# Patient Record
Sex: Female | Born: 1975 | Race: White | Hispanic: No | Marital: Married | Smoking: Never smoker
Health system: Southern US, Community
[De-identification: ages and names within clinical notes are randomized; demographics above are authoritative.]

---

## 1996-11-26 HISTORY — PX: LAPAROSCOPY: SHX197

## 1999-10-24 ENCOUNTER — Other Ambulatory Visit: Admission: RE | Admit: 1999-10-24 | Discharge: 1999-10-24 | Payer: Self-pay | Admitting: Gynecology

## 2000-11-11 ENCOUNTER — Other Ambulatory Visit: Admission: RE | Admit: 2000-11-11 | Discharge: 2000-11-11 | Payer: Self-pay | Admitting: Gynecology

## 2001-05-19 ENCOUNTER — Other Ambulatory Visit: Admission: RE | Admit: 2001-05-19 | Discharge: 2001-05-19 | Payer: Self-pay | Admitting: Gynecology

## 2001-11-04 ENCOUNTER — Other Ambulatory Visit: Admission: RE | Admit: 2001-11-04 | Discharge: 2001-11-04 | Payer: Self-pay | Admitting: Gynecology

## 2002-11-16 ENCOUNTER — Other Ambulatory Visit: Admission: RE | Admit: 2002-11-16 | Discharge: 2002-11-16 | Payer: Self-pay | Admitting: Gynecology

## 2003-11-23 ENCOUNTER — Other Ambulatory Visit: Admission: RE | Admit: 2003-11-23 | Discharge: 2003-11-23 | Payer: Self-pay | Admitting: Gynecology

## 2004-11-30 ENCOUNTER — Other Ambulatory Visit: Admission: RE | Admit: 2004-11-30 | Discharge: 2004-11-30 | Payer: Self-pay | Admitting: Gynecology

## 2005-08-21 ENCOUNTER — Encounter: Admission: RE | Admit: 2005-08-21 | Discharge: 2005-08-21 | Payer: Self-pay | Admitting: Family Medicine

## 2005-11-09 ENCOUNTER — Encounter: Admission: RE | Admit: 2005-11-09 | Discharge: 2005-11-09 | Payer: Self-pay | Admitting: Family Medicine

## 2005-12-03 ENCOUNTER — Encounter: Admission: RE | Admit: 2005-12-03 | Discharge: 2005-12-03 | Payer: Self-pay | Admitting: Family Medicine

## 2005-12-05 ENCOUNTER — Other Ambulatory Visit: Admission: RE | Admit: 2005-12-05 | Discharge: 2005-12-05 | Payer: Self-pay | Admitting: Gynecology

## 2006-07-31 ENCOUNTER — Encounter: Admission: RE | Admit: 2006-07-31 | Discharge: 2006-07-31 | Payer: Self-pay | Admitting: Family Medicine

## 2006-11-12 ENCOUNTER — Other Ambulatory Visit: Admission: RE | Admit: 2006-11-12 | Discharge: 2006-11-12 | Payer: Self-pay | Admitting: Gynecology

## 2007-01-27 ENCOUNTER — Encounter: Admission: RE | Admit: 2007-01-27 | Discharge: 2007-01-27 | Payer: Self-pay | Admitting: Family Medicine

## 2007-10-21 ENCOUNTER — Ambulatory Visit (HOSPITAL_COMMUNITY): Admission: RE | Admit: 2007-10-21 | Discharge: 2007-10-21 | Payer: Self-pay | Admitting: Gynecology

## 2007-12-09 ENCOUNTER — Encounter (INDEPENDENT_AMBULATORY_CARE_PROVIDER_SITE_OTHER): Payer: Self-pay | Admitting: Plastic Surgery

## 2007-12-09 ENCOUNTER — Ambulatory Visit (HOSPITAL_BASED_OUTPATIENT_CLINIC_OR_DEPARTMENT_OTHER): Admission: RE | Admit: 2007-12-09 | Discharge: 2007-12-09 | Payer: Self-pay | Admitting: Plastic Surgery

## 2008-05-12 ENCOUNTER — Encounter: Admission: RE | Admit: 2008-05-12 | Discharge: 2008-05-12 | Payer: Self-pay | Admitting: Family Medicine

## 2008-05-18 ENCOUNTER — Ambulatory Visit (HOSPITAL_COMMUNITY): Admission: RE | Admit: 2008-05-18 | Discharge: 2008-05-18 | Payer: Self-pay | Admitting: Gynecology

## 2008-11-11 ENCOUNTER — Other Ambulatory Visit: Admission: RE | Admit: 2008-11-11 | Discharge: 2008-11-11 | Payer: Self-pay | Admitting: Gynecology

## 2009-06-21 ENCOUNTER — Encounter: Admission: RE | Admit: 2009-06-21 | Discharge: 2009-06-21 | Payer: Self-pay | Admitting: Gynecology

## 2009-07-31 IMAGING — MG MM SCREEN MAMMOGRAM BILATERAL
4 series · 4 of 4 positions shown · non-contrast
Comparison: none

DG SCREEN MAMMOGRAM BILATERAL
Bilateral CC and MLO view(s) were taken.

DIGITAL SCREENING MAMMOGRAM WITH CAD:
The breast tissue is heterogeneously dense.  No masses or malignant type calcifications are 
identified.  Compared with prior studies.

[R CC]
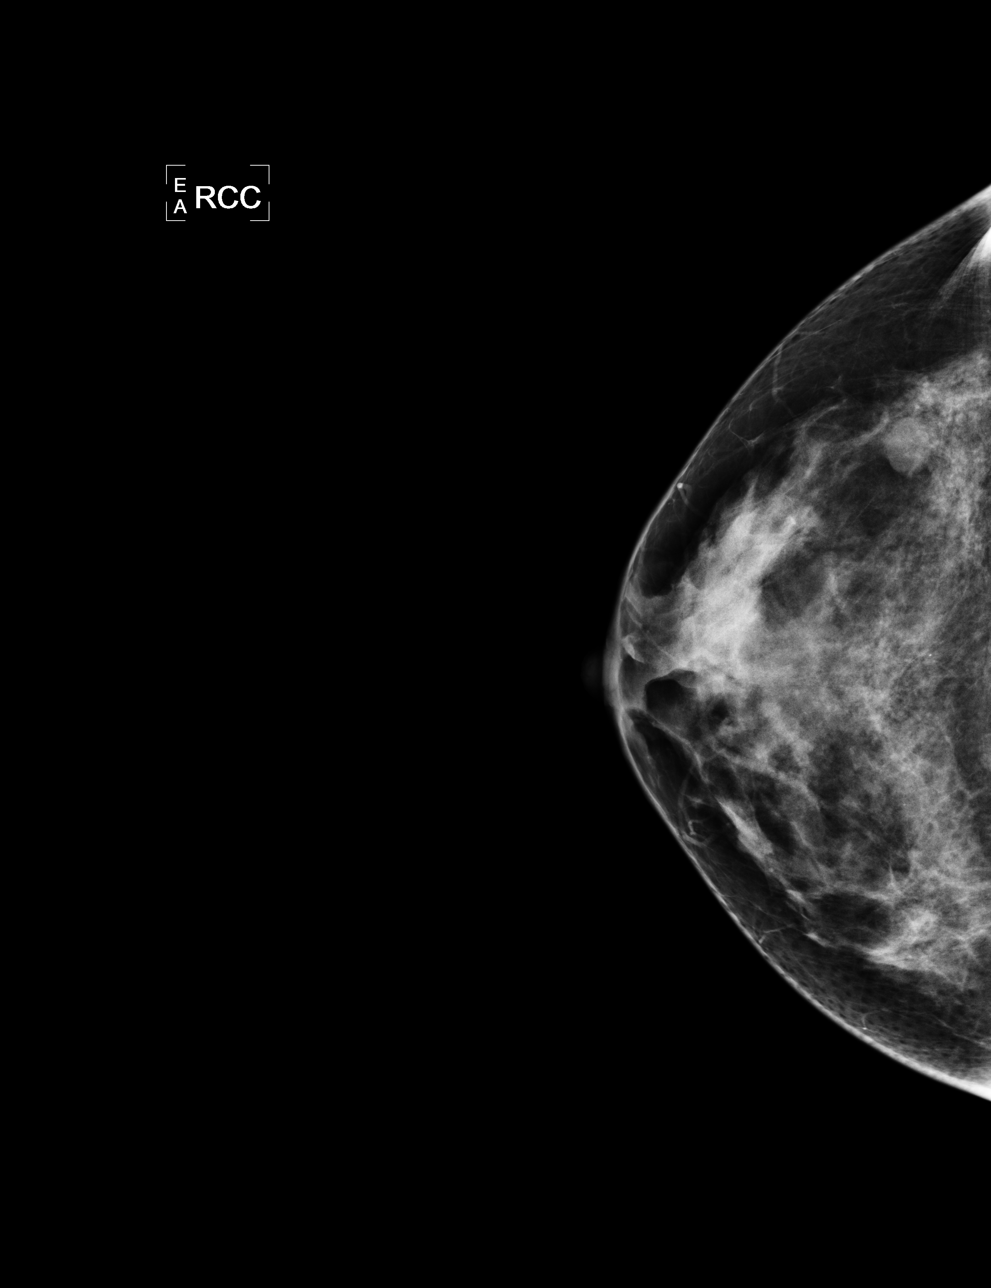

[L CC]
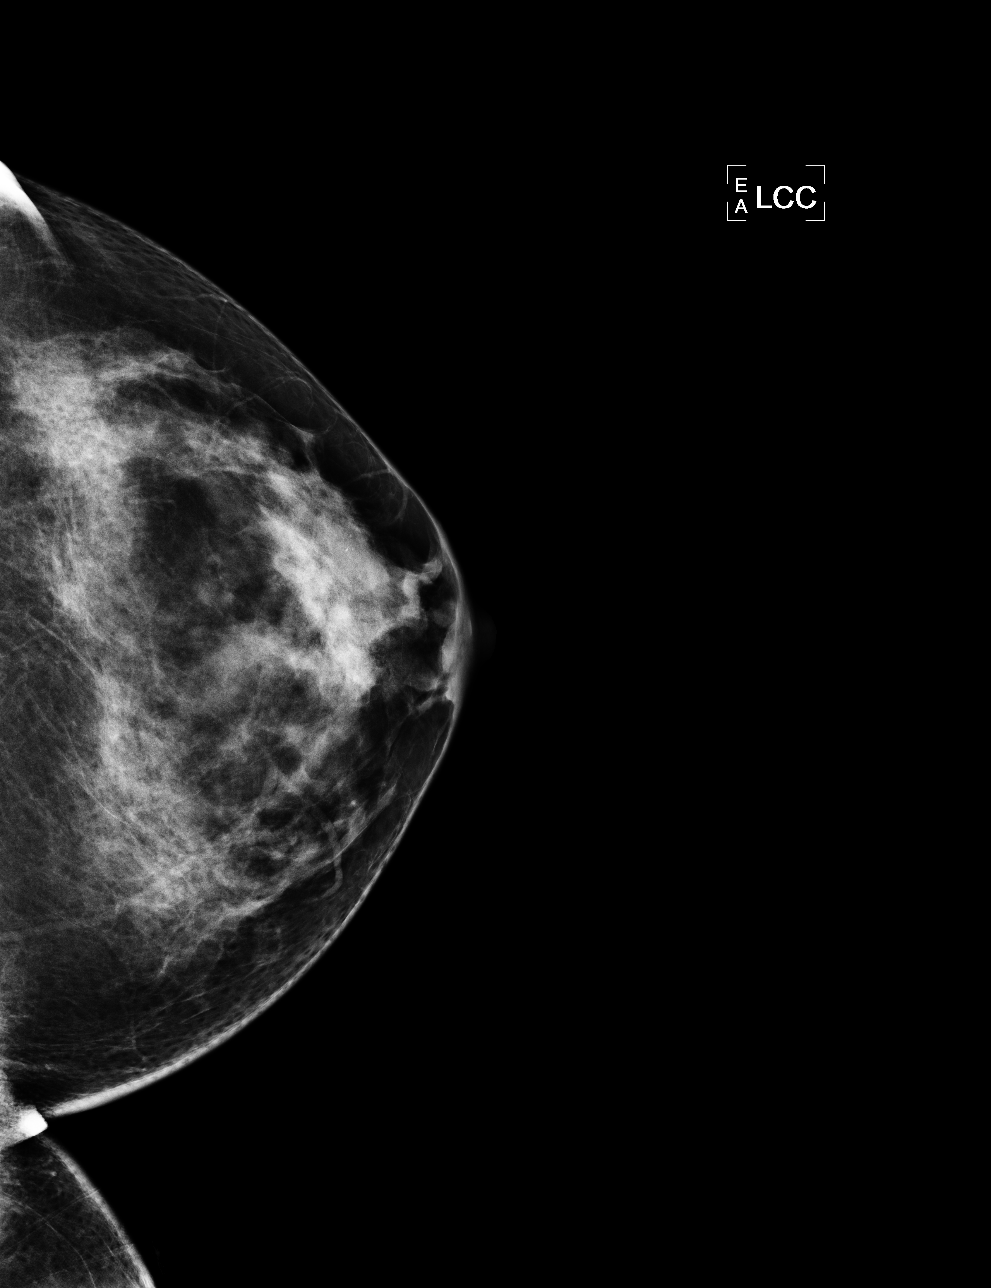

[L MLO]
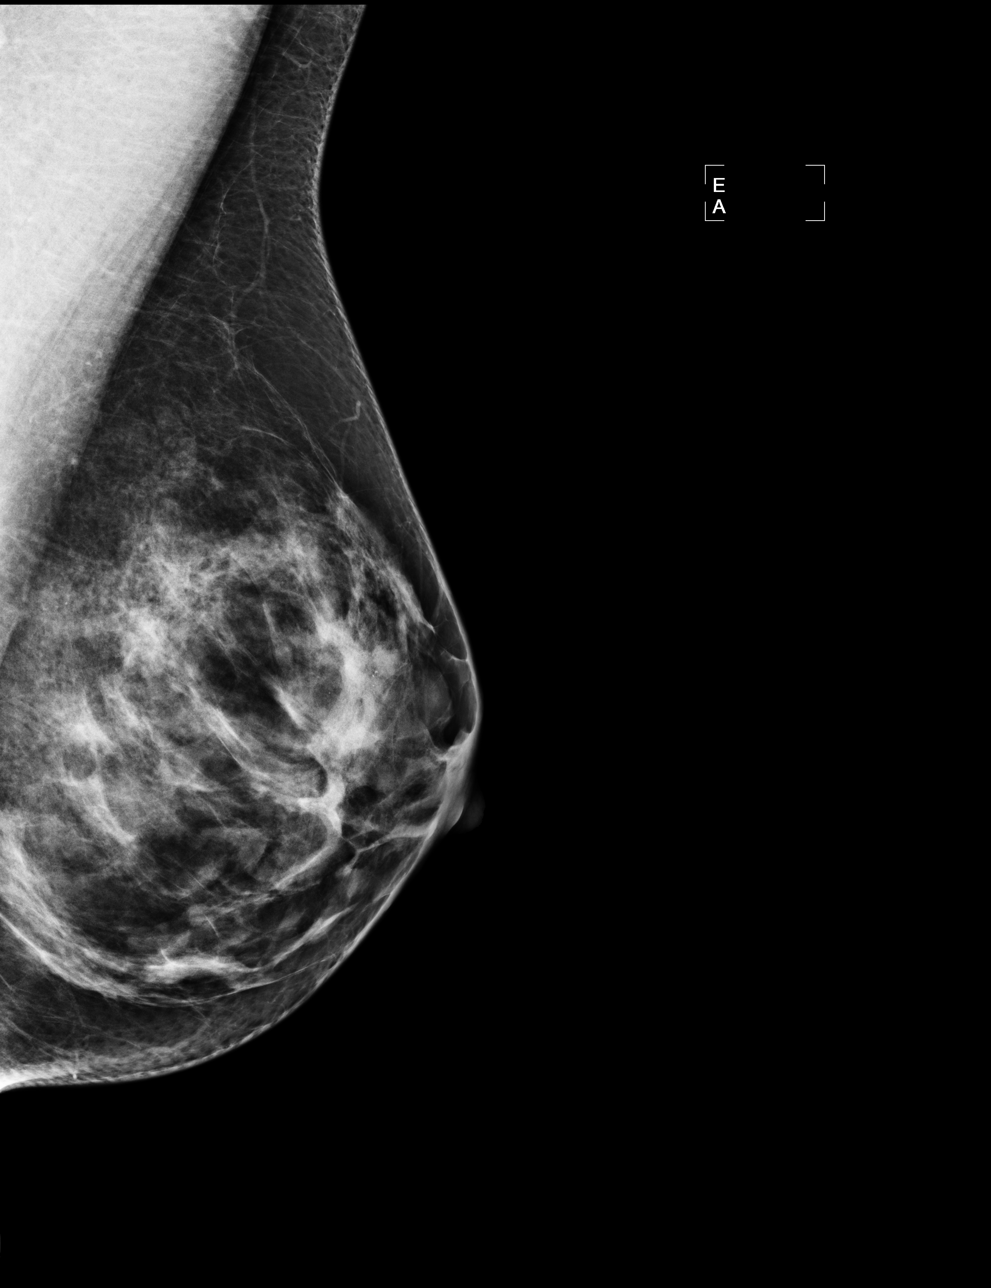

[R MLO]
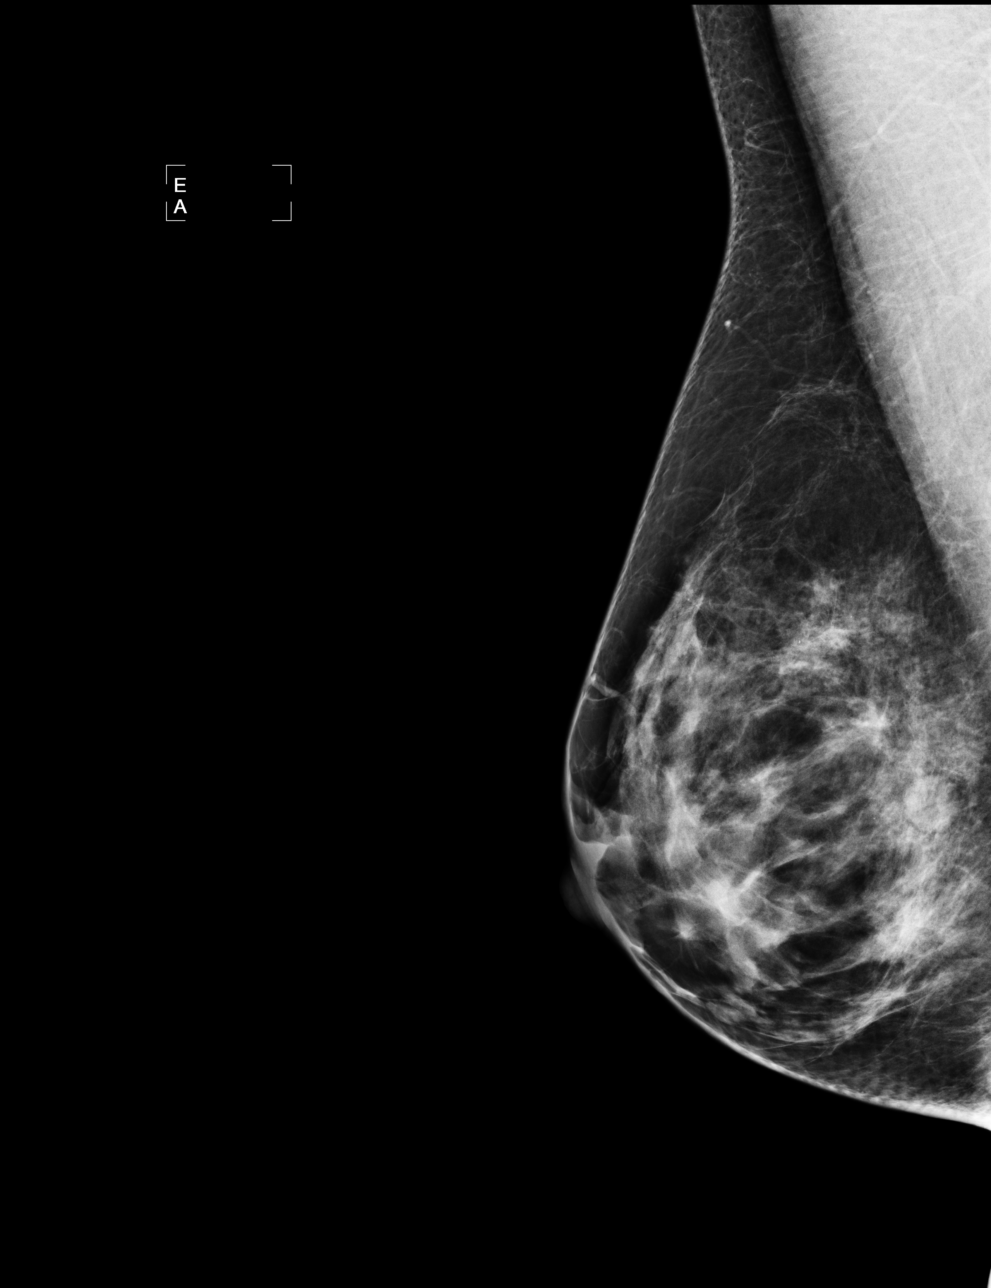

[4 of 4 positions shown; findings below may reference images not displayed]

IMPRESSION: No specific mammographic evidence of malignancy.  Next screening mammogram is recommended in one 
year.

ASSESSMENT: Negative - BI-RADS 1

Screening mammogram in 1 year.
ANALYZED BY COMPUTER AIDED DETECTION. , THIS PROCEDURE WAS A DIGITAL MAMMOGRAM.

## 2009-12-14 ENCOUNTER — Inpatient Hospital Stay (HOSPITAL_COMMUNITY): Admission: AD | Admit: 2009-12-14 | Discharge: 2009-12-17 | Payer: Self-pay | Admitting: Obstetrics & Gynecology

## 2009-12-16 ENCOUNTER — Encounter: Payer: Self-pay | Admitting: Obstetrics and Gynecology

## 2010-01-04 ENCOUNTER — Inpatient Hospital Stay (HOSPITAL_COMMUNITY): Admission: AD | Admit: 2010-01-04 | Discharge: 2010-01-22 | Payer: Self-pay | Admitting: Obstetrics and Gynecology

## 2010-01-06 ENCOUNTER — Encounter: Payer: Self-pay | Admitting: Obstetrics and Gynecology

## 2010-01-12 ENCOUNTER — Encounter: Payer: Self-pay | Admitting: Obstetrics and Gynecology

## 2010-01-12 IMAGING — US US OB COMP EACH ADDL GEST+14 WKS
1 series · 14 of 28 positions shown · non-contrast
Comparison: none

OBSTETRICAL ULTRASOUND:
 This ultrasound was performed in The [HOSPITAL], and the [facility] report will be stored to [REDACTED] PACS.  This report is also available in [HOSPITAL]?s accessANYware.

[Series 1: us ob comp each addl gest+14 wks · 14 of 107 slices shown]
[im 4/107]
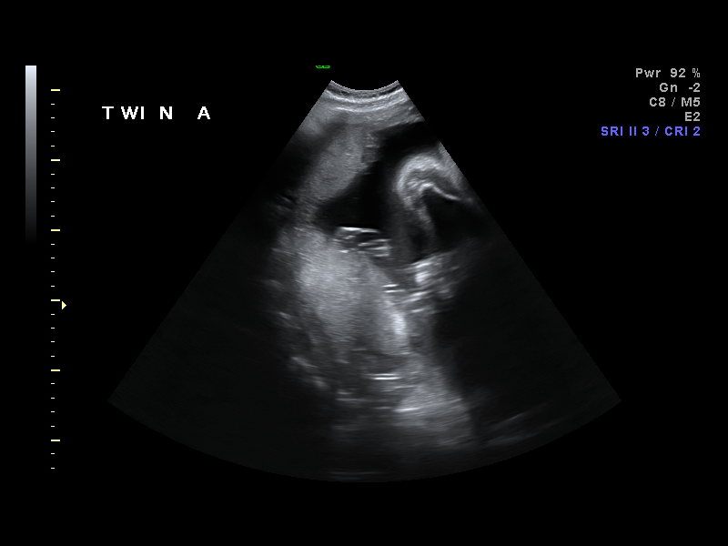
[im 12/107]
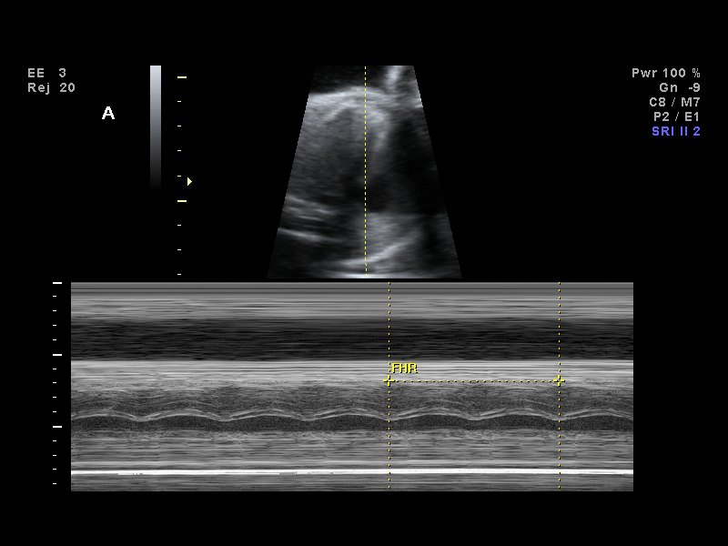
[im 20/107]
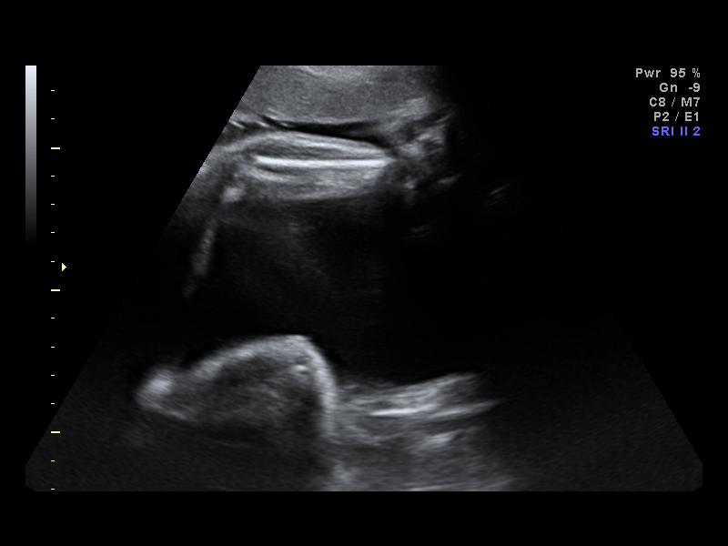
[im 28/107]
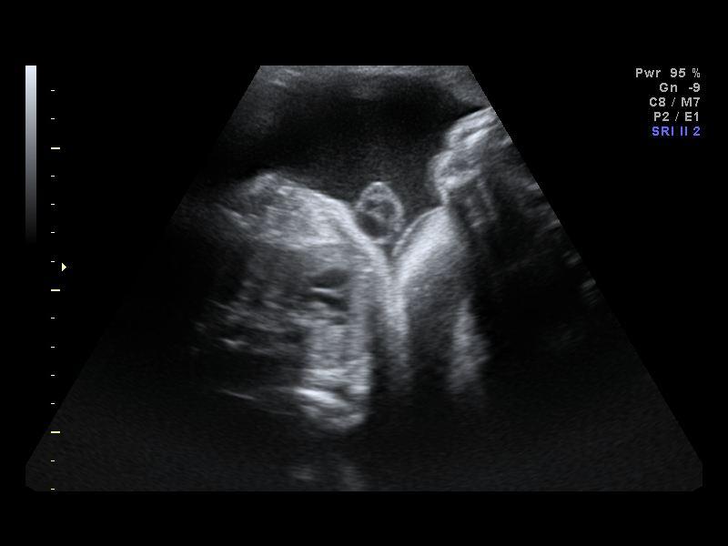
[im 36/107]
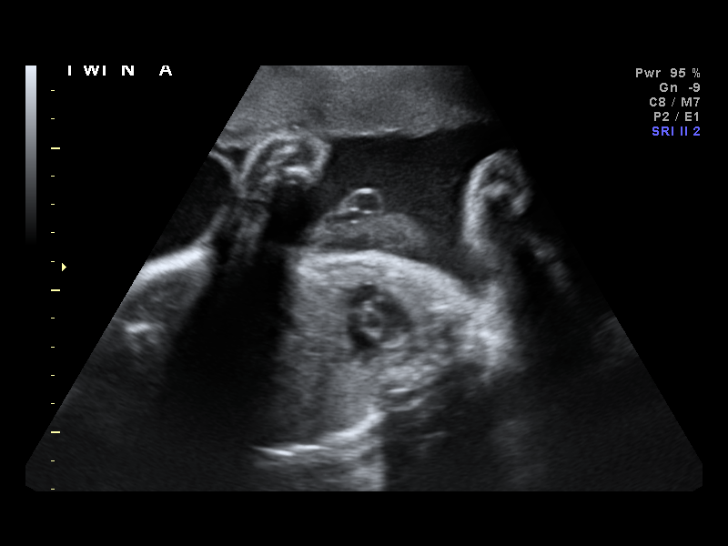
[im 44/107]
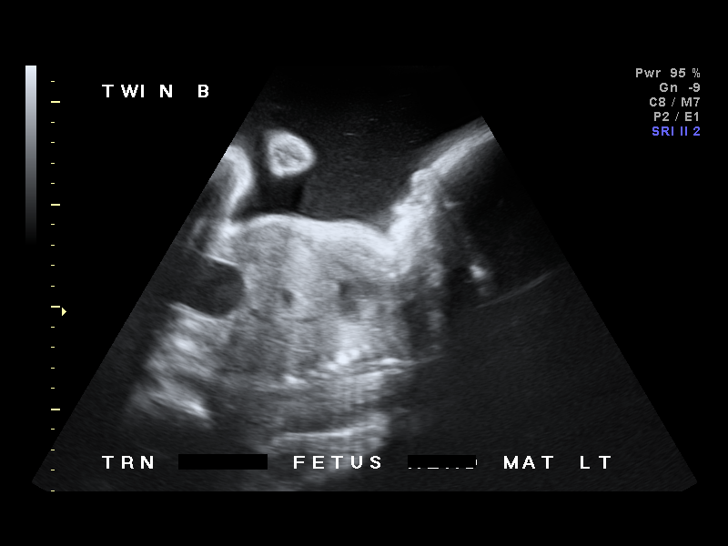
[im 52/107]
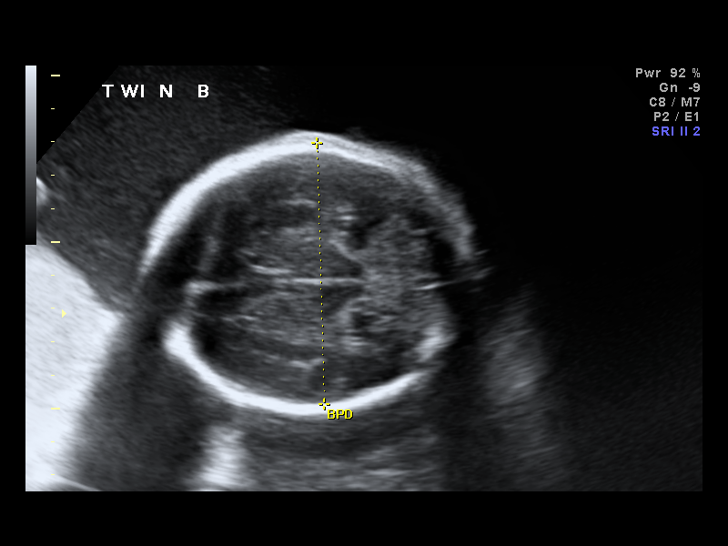
[im 59/107]
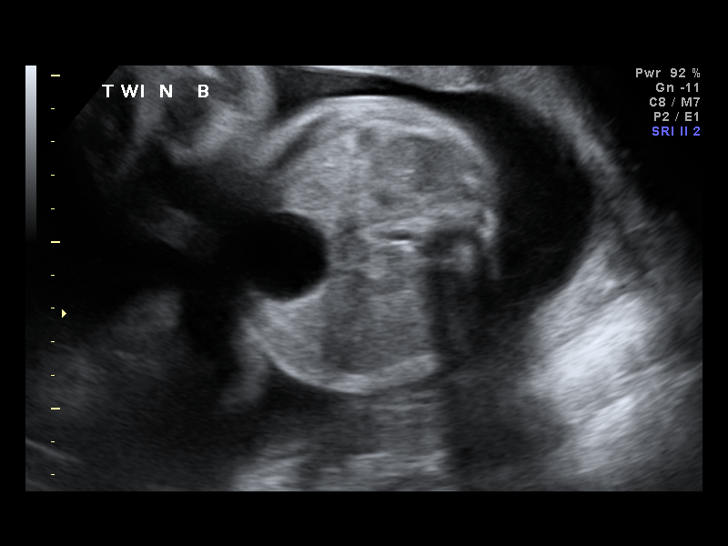
[im 67/107]
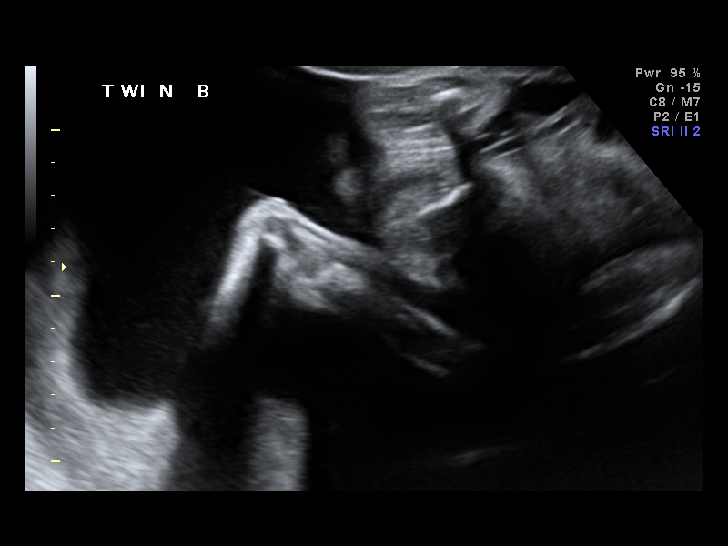
[im 75/107]
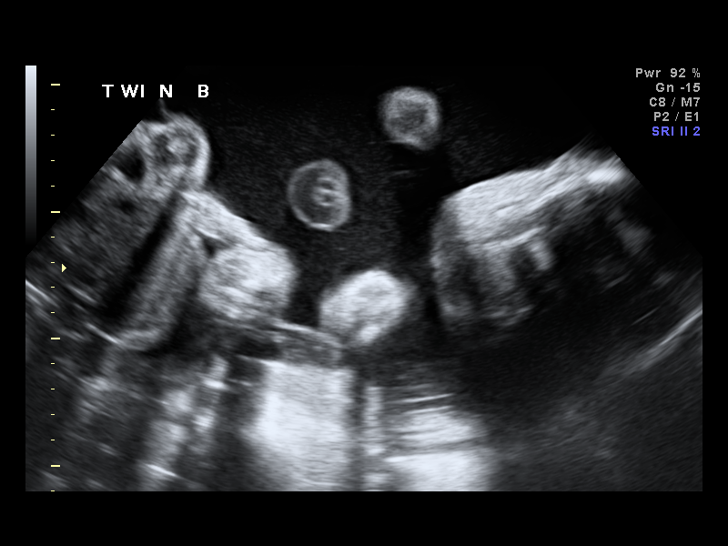
[im 83/107]
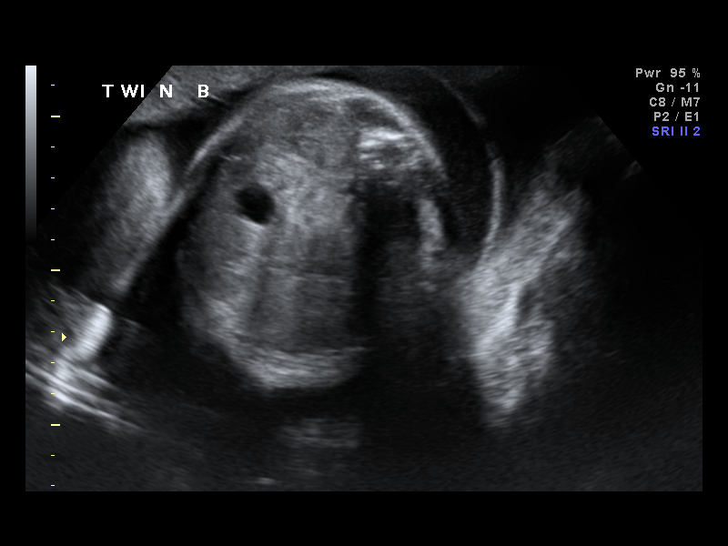
[im 91/107]
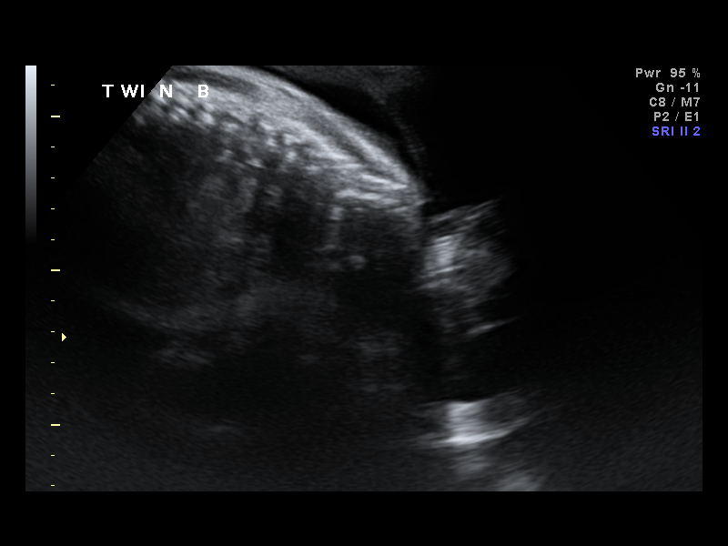
[im 99/107]
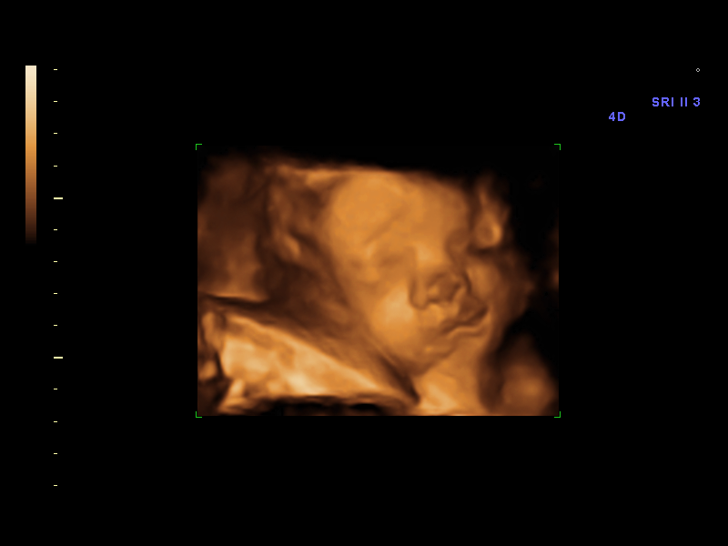
[im 107/107]
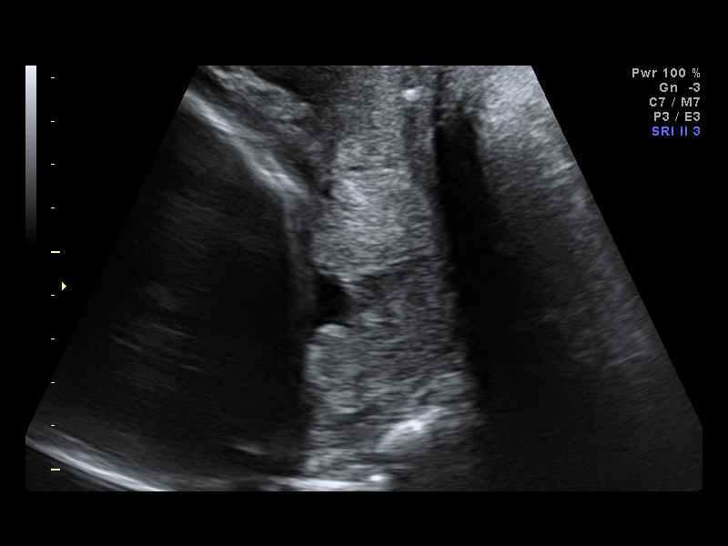

[14 of 28 positions shown; findings below may reference images not displayed]

IMPRESSION: [facility] has also been faxed to the ordering physician.

## 2010-01-18 ENCOUNTER — Encounter (INDEPENDENT_AMBULATORY_CARE_PROVIDER_SITE_OTHER): Payer: Self-pay | Admitting: Obstetrics and Gynecology

## 2010-01-23 ENCOUNTER — Encounter: Admission: RE | Admit: 2010-01-23 | Discharge: 2010-02-22 | Payer: Self-pay | Admitting: Obstetrics and Gynecology

## 2010-03-24 ENCOUNTER — Encounter: Admission: RE | Admit: 2010-03-24 | Discharge: 2010-04-23 | Payer: Self-pay | Admitting: Obstetrics and Gynecology

## 2010-04-24 ENCOUNTER — Encounter: Admission: RE | Admit: 2010-04-24 | Discharge: 2010-05-09 | Payer: Self-pay | Admitting: Obstetrics and Gynecology

## 2010-12-14 ENCOUNTER — Other Ambulatory Visit: Payer: Self-pay | Admitting: Dermatology

## 2011-01-04 ENCOUNTER — Other Ambulatory Visit: Payer: Self-pay | Admitting: Dermatology

## 2011-02-12 LAB — STREP B DNA PROBE: Strep Group B Ag: NEGATIVE

## 2011-02-12 LAB — COMPREHENSIVE METABOLIC PANEL
AST: 24 U/L (ref 0–37)
CO2: 23 mEq/L (ref 19–32)
Calcium: 9.5 mg/dL (ref 8.4–10.5)
Creatinine, Ser: 0.66 mg/dL (ref 0.4–1.2)
GFR calc Af Amer: >60 mL/min (ref >60)
GFR calc non Af Amer: >60 mL/min (ref >60)
Total Protein: 5.8 g/dL — ABNORMAL LOW (ref 6.0–8.3)

## 2011-02-12 LAB — CBC
MCHC: 34.3 g/dL (ref 30.0–36.0)
MCV: 98.8 fL (ref 78.0–100.0)
RBC: 3.26 MIL/uL — ABNORMAL LOW (ref 3.87–5.11)
RDW: 13.2 % (ref 11.5–15.5)

## 2011-02-12 LAB — LACTATE DEHYDROGENASE: LDH: 114 U/L (ref 94–250)

## 2011-02-12 LAB — MAGNESIUM: Magnesium: 3.3 mg/dL — ABNORMAL HIGH (ref 1.5–2.5)

## 2011-02-15 LAB — COMPREHENSIVE METABOLIC PANEL
ALT: 17 U/L (ref 0–35)
ALT: 26 U/L (ref 0–35)
AST: 24 U/L (ref 0–37)
Albumin: 1.6 g/dL — ABNORMAL LOW (ref 3.5–5.2)
Albumin: 1.6 g/dL — ABNORMAL LOW (ref 3.5–5.2)
Albumin: 1.7 g/dL — ABNORMAL LOW (ref 3.5–5.2)
Albumin: 1.8 g/dL — ABNORMAL LOW (ref 3.5–5.2)
Alkaline Phosphatase: 103 U/L (ref 39–117)
Alkaline Phosphatase: 106 U/L (ref 39–117)
Alkaline Phosphatase: 123 U/L — ABNORMAL HIGH (ref 39–117)
Alkaline Phosphatase: 85 U/L (ref 39–117)
BUN: 10 mg/dL (ref 6–23)
BUN: 10 mg/dL (ref 6–23)
BUN: 6 mg/dL (ref 6–23)
BUN: 6 mg/dL (ref 6–23)
BUN: 6 mg/dL (ref 6–23)
CO2: 20 mEq/L (ref 19–32)
Calcium: 8.7 mg/dL (ref 8.4–10.5)
Calcium: 8.8 mg/dL (ref 8.4–10.5)
Chloride: 101 mEq/L (ref 96–112)
Chloride: 102 mEq/L (ref 96–112)
Chloride: 102 mEq/L (ref 96–112)
Creatinine, Ser: 1.29 mg/dL — ABNORMAL HIGH (ref 0.4–1.2)
Creatinine, Ser: 1.43 mg/dL — ABNORMAL HIGH (ref 0.4–1.2)
Creatinine, Ser: 1.7 mg/dL — ABNORMAL HIGH (ref 0.4–1.2)
GFR calc Af Amer: >60 mL/min (ref >60)
GFR calc non Af Amer: 42 mL/min — ABNORMAL LOW (ref >60)
Glucose, Bld: 76 mg/dL (ref 70–99)
Glucose, Bld: 78 mg/dL (ref 70–99)
Glucose, Bld: 89 mg/dL (ref 70–99)
Potassium: 3.3 mEq/L — ABNORMAL LOW (ref 3.5–5.1)
Potassium: 3.3 mEq/L — ABNORMAL LOW (ref 3.5–5.1)
Potassium: 3.4 mEq/L — ABNORMAL LOW (ref 3.5–5.1)
Sodium: 131 mEq/L — ABNORMAL LOW (ref 135–145)
Sodium: 133 mEq/L — ABNORMAL LOW (ref 135–145)
Sodium: 138 mEq/L (ref 135–145)
Total Bilirubin: 0.4 mg/dL (ref 0.3–1.2)
Total Bilirubin: 0.8 mg/dL (ref 0.3–1.2)
Total Protein: 4.5 g/dL — ABNORMAL LOW (ref 6.0–8.3)
Total Protein: 4.5 g/dL — ABNORMAL LOW (ref 6.0–8.3)
Total Protein: 4.6 g/dL — ABNORMAL LOW (ref 6.0–8.3)
Total Protein: 4.9 g/dL — ABNORMAL LOW (ref 6.0–8.3)
Total Protein: 5 g/dL — ABNORMAL LOW (ref 6.0–8.3)

## 2011-02-15 LAB — CBC
HCT: 30.5 % — ABNORMAL LOW (ref 36.0–46.0)
HCT: 30.9 % — ABNORMAL LOW (ref 36.0–46.0)
HCT: 31.3 % — ABNORMAL LOW (ref 36.0–46.0)
HCT: 31.8 % — ABNORMAL LOW (ref 36.0–46.0)
HCT: 32.3 % — ABNORMAL LOW (ref 36.0–46.0)
Hemoglobin: 10.4 g/dL — ABNORMAL LOW (ref 12.0–15.0)
Hemoglobin: 10.5 g/dL — ABNORMAL LOW (ref 12.0–15.0)
Hemoglobin: 10.7 g/dL — ABNORMAL LOW (ref 12.0–15.0)
Hemoglobin: 10.9 g/dL — ABNORMAL LOW (ref 12.0–15.0)
Hemoglobin: 11 g/dL — ABNORMAL LOW (ref 12.0–15.0)
Hemoglobin: 11 g/dL — ABNORMAL LOW (ref 12.0–15.0)
Hemoglobin: 9.9 g/dL — ABNORMAL LOW (ref 12.0–15.0)
MCHC: 33.8 g/dL (ref 30.0–36.0)
MCHC: 34 g/dL (ref 30.0–36.0)
MCHC: 34 g/dL (ref 30.0–36.0)
MCHC: 34.1 g/dL (ref 30.0–36.0)
MCHC: 34.8 g/dL (ref 30.0–36.0)
MCV: 98.5 fL (ref 78.0–100.0)
MCV: 98.5 fL (ref 78.0–100.0)
MCV: 98.6 fL (ref 78.0–100.0)
MCV: 99.1 fL (ref 78.0–100.0)
MCV: 99.2 fL (ref 78.0–100.0)
MCV: 99.3 fL (ref 78.0–100.0)
MCV: 99.5 fL (ref 78.0–100.0)
MCV: 99.5 fL (ref 78.0–100.0)
Platelets: 155 10*3/uL (ref 150–400)
Platelets: 160 10*3/uL (ref 150–400)
Platelets: 175 10*3/uL (ref 150–400)
Platelets: 183 10*3/uL (ref 150–400)
Platelets: 198 10*3/uL (ref 150–400)
Platelets: 208 10*3/uL (ref 150–400)
RBC: 2.84 MIL/uL — ABNORMAL LOW (ref 3.87–5.11)
RBC: 3.23 MIL/uL — ABNORMAL LOW (ref 3.87–5.11)
RDW: 12.9 % (ref 11.5–15.5)
RDW: 13 % (ref 11.5–15.5)
RDW: 13 % (ref 11.5–15.5)
RDW: 13.1 % (ref 11.5–15.5)
RDW: 13.2 % (ref 11.5–15.5)
RDW: 13.6 % (ref 11.5–15.5)
WBC: 10.1 10*3/uL (ref 4.0–10.5)
WBC: 7.1 10*3/uL (ref 4.0–10.5)
WBC: 8.3 10*3/uL (ref 4.0–10.5)

## 2011-02-15 LAB — DIFFERENTIAL
Basophils Absolute: 0 10*3/uL (ref 0.0–0.1)
Basophils Relative: 0 % (ref 0–1)
Lymphocytes Relative: 7 % — ABNORMAL LOW (ref 12–46)
Lymphocytes Relative: 9 % — ABNORMAL LOW (ref 12–46)
Lymphs Abs: 0.7 10*3/uL (ref 0.7–4.0)
Monocytes Relative: 8 % (ref 3–12)
Neutro Abs: 7 10*3/uL (ref 1.7–7.7)
Neutro Abs: 8.2 10*3/uL — ABNORMAL HIGH (ref 1.7–7.7)
Neutrophils Relative %: 83 % — ABNORMAL HIGH (ref 43–77)
Neutrophils Relative %: 85 % — ABNORMAL HIGH (ref 43–77)

## 2011-02-15 LAB — URIC ACID
Uric Acid, Serum: 7.7 mg/dL — ABNORMAL HIGH (ref 2.4–7.0)
Uric Acid, Serum: 8.4 mg/dL — ABNORMAL HIGH (ref 2.4–7.0)
Uric Acid, Serum: 9.1 mg/dL — ABNORMAL HIGH (ref 2.4–7.0)

## 2011-02-15 LAB — URINALYSIS, ROUTINE W REFLEX MICROSCOPIC
Bilirubin Urine: NEGATIVE
Glucose, UA: NEGATIVE mg/dL
Protein, ur: NEGATIVE mg/dL
Specific Gravity, Urine: 1.02 (ref 1.005–1.030)
Urobilinogen, UA: 0.2 mg/dL (ref 0.0–1.0)

## 2011-02-15 LAB — URINE MICROSCOPIC-ADD ON

## 2011-02-15 LAB — CREATININE CLEARANCE, URINE, 24 HOUR
Collection Interval-CRCL: 24 hours
Creatinine Clearance: 49 mL/min — ABNORMAL LOW (ref 75–115)
Creatinine, Urine: 70 mg/dL
Creatinine: 1.7 mg/dL — ABNORMAL HIGH (ref 0.4–1.2)

## 2011-02-15 LAB — URINE CULTURE

## 2011-02-15 LAB — PROTEIN, URINE, 24 HOUR: Urine Total Volume-UPROT: 1700 mL

## 2011-02-15 LAB — MAGNESIUM
Magnesium: 3.8 mg/dL — ABNORMAL HIGH (ref 1.5–2.5)
Magnesium: 8.1 mg/dL (ref 1.5–2.5)
Magnesium: 8.8 mg/dL (ref 1.5–2.5)

## 2011-02-15 LAB — LACTATE DEHYDROGENASE: LDH: 191 U/L (ref 94–250)

## 2011-04-10 NOTE — Op Note (Signed)
NAME:  Jessica Graves, Jessica Graves              ACCOUNT NO.:  [id number]   MEDICAL RECORD NO.:  [id number]          PATIENT TYPE:  AMB   LOCATION:  DSC                          FACILITY:  [facility]   PHYSICIAN:  Brantley Persons, M.D.DATE OF BIRTH:  [birth year]   DATE OF PROCEDURE:  12/09/2007  DATE OF DISCHARGE:                               OPERATIVE REPORT   PREOPERATIVE DIAGNOSES:  1. 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  2. Left lower abdomen dysplastic nevus with moderate to marked atypia.   POSTOPERATIVE DIAGNOSES:  1. 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  2. Left lower abdomen dysplastic nevus with moderate to marked atypia.   PROCEDURES:  1. Wide local excision 2.3 cm left paraspinal mid back dysplastic      nevus bordering on early evolving melanoma.  2. Complex closure of 5.0 cm left paraspinal mid back incision.  3. Wide local excision 1.7 cm left lower abdomen dysplastic nevus with      moderate to marked atypia.  4. Complex closure 4.0 cm left lower abdomen incision.   ATTENDING SURGEON:  Brantley Persons, M.D.   ANESTHESIA:  IV sedation along with 1% lidocaine with epinephrine.   ANESTHESIOLOGIST:  Guadalupe Maple, M.D.   COMPLICATIONS:  None.   INDICATIONS FOR PROCEDURE:  The patient is a 35 year old Caucasian  female who has a biopsy-proven left paraspinal mid back dysplastic nevus  bordering on early evolving melanoma along with the left lower abdomen  dysplastic nevus with moderate to marked atypia both of which need wide  local excision.  We will proceed with 5 mm margins on the left  paraspinal mid back dysplastic nevus and 3 mm margins with the left  lower abdomen dysplastic nevus.  She asks that we proceed with surgery  at this time.   PROCEDURE:  The lesions that were to be excised were both marked in the  preop holding area.  The patient was then taken back to the OR and IV  sedation was obtained.  The patient was then placed in the lateral  decubitus position with the right side down.  The back was then prepped  with Betadine and draped in sterile fashion.  The skin and subcutaneous  tissues were injected in the area of the healing biopsy site and  residual dysplastic nevus of the left paraspinal mid back area with the  1% lidocaine with epinephrine.  After adequate hemostasis anesthesia  taken effect the procedure was begun.   Using loupe LAD magnification, the healing biopsy site any residual  dysplastic nevus was identified.  5 mm circumferential margins were  marked around this biopsy site.  The lesion was excised full thickness  through the skin into the subcutaneous tissues and down to the muscular  fascial  layer.  The specimen was marked at 12 o'clock position with a  silk suture passed off the table to undergo permanent pathologic section  evaluation.  Total length of specimen  excision was 2.3 cm.  The skin and  soft tissues were then undermined for closure of the wound.  A complex  closure of the incision then proceeded.  The superficial fascial layer  deep subcutaneous tissues were closed using 2-0 Monocryl suture.  The  dermal layer was also closed with 2-0 Monocryl suture.  The superficial  dermal layer was closed with 3-0 Monocryl suture.  The skin was then  closed with a 3-0 Monocryl in a running intracuticular stitch.  The  incision was dressed with Steri-Strips.  The skin and soft tissues and  muscular layer were then infiltrated with half percent Marcaine with  epinephrine to provide a postsurgical anesthetic block for the patient.  The incision was dressed with a 4x4 and Hypafix tape.   The patient was then placed in the supine position.  The abdomen was  prepped with Betadine and alcohol and draped sterile fashion.  The skin  and soft tissues in the area of the  dysplastic nevus that was to be  excised were then injected 1% lidocaine with epinephrine.  After  adequate hemostasis and anesthesia had taken effect, the procedure was  begun.   Again, using loupe magnification, the borders of the healing biopsy site  any residual dysplastic nevus were identified.  3 mm circumferential  margins were marked around the skin lesion.  The skin lesion was excised  full thickness through the skin into the subcutaneous tissues down to  the superficial fascial layer.  The specimen marked at 12 o'clock.  The  specimen was marked in the 12 o'clock position with a silk suture,  passed off the table to undergo permanent pathologic section evaluation.  Total length of specimen excision was 1.7 cm.  The skin and soft tissues  were then undermined for closure of the wound.  A complex closure then  ensued of the incision.  The subcutaneous tissues and deep dermal layer  were closed with 2-0 Monocryl suture.  The deep dermal layer was closed  with a 3-0 Monocryl suture.  The skin was then closed with a 3-0  Monocryl in a running intracuticular stitch.  Total length of incision  closure was 4.0 cm.  The total length of the left paraspinal back  incision closure had been 5.0 cm.  The left lower abdomen incision was  then dressed with Steri-Strips.  The skin and soft tissues were  infiltrated with half percent Marcaine with epinephrine to provide a  postsurgical anesthetic block.  A 4x4 and Hypafix tape was then placed  for dressing.  There are no complications.  The patient tolerated  procedure well.  The final needle and sponge counts report to be correct  end of the case.   The patient was then awakened from the IV sedation and taken to recovery  room in stable condition.  She was recovered without complications.  The  patient and husband were given proper postoperative wound care  instructions and she was discharged home in her husband's care in stable  condition.   Follow-up appointment will be tomorrow in the office.           ______________________________  Brantley Persons, M.D.     MC/MEDQ  D:  12/09/2007  T:  12/09/2007  Job:  [id number]

## 2011-06-15 ENCOUNTER — Other Ambulatory Visit (HOSPITAL_COMMUNITY): Payer: Self-pay | Admitting: Obstetrics and Gynecology

## 2011-06-15 DIAGNOSIS — K432 Incisional hernia without obstruction or gangrene: Secondary | ICD-10-CM

## 2011-06-19 ENCOUNTER — Ambulatory Visit (HOSPITAL_COMMUNITY): Admission: RE | Admit: 2011-06-19 | Payer: BC Managed Care – PPO | Source: Ambulatory Visit

## 2011-06-22 ENCOUNTER — Ambulatory Visit (HOSPITAL_COMMUNITY): Payer: BC Managed Care – PPO

## 2011-12-12 ENCOUNTER — Other Ambulatory Visit: Payer: Self-pay | Admitting: Dermatology

## 2013-02-25 ENCOUNTER — Other Ambulatory Visit: Payer: Self-pay | Admitting: Dermatology

## 2013-09-14 ENCOUNTER — Other Ambulatory Visit: Payer: Self-pay | Admitting: Dermatology

## 2013-09-17 ENCOUNTER — Other Ambulatory Visit: Payer: Self-pay | Admitting: Obstetrics and Gynecology

## 2013-09-17 DIAGNOSIS — R928 Other abnormal and inconclusive findings on diagnostic imaging of breast: Secondary | ICD-10-CM

## 2013-10-06 ENCOUNTER — Other Ambulatory Visit: Payer: BC Managed Care – PPO

## 2013-10-26 ENCOUNTER — Ambulatory Visit
Admission: RE | Admit: 2013-10-26 | Discharge: 2013-10-26 | Disposition: A | Discharge status: Home / Self Care | Payer: BC Managed Care – PPO | Source: Ambulatory Visit | Attending: Obstetrics and Gynecology | Admitting: Obstetrics and Gynecology

## 2013-10-26 DIAGNOSIS — R928 Other abnormal and inconclusive findings on diagnostic imaging of breast: Secondary | ICD-10-CM

## 2014-03-22 ENCOUNTER — Telehealth: Payer: Self-pay | Admitting: Interventional Cardiology

## 2014-03-22 NOTE — Telephone Encounter (Signed)
New message     Calling regarding a form for life ins that was faxed to [place].

## 2014-03-24 NOTE — Telephone Encounter (Signed)
Spoke with Mauren at [employer] and let her know we have received form.

## 2014-03-31 NOTE — Telephone Encounter (Signed)
New problem   Want to know status of cardiac questionnaire. Please call.

## 2014-04-01 NOTE — Telephone Encounter (Signed)
Faxing form today. It was filled out by Dr. Irish Lack yesterday.

## 2014-09-06 ENCOUNTER — Other Ambulatory Visit: Payer: Self-pay | Admitting: Dermatology

## 2014-11-03 ENCOUNTER — Other Ambulatory Visit: Payer: Self-pay | Admitting: Obstetrics and Gynecology

## 2014-11-04 LAB — CYTOLOGY - PAP

## 2015-03-24 ENCOUNTER — Other Ambulatory Visit: Payer: Self-pay | Admitting: Dermatology

## 2016-11-27 DIAGNOSIS — Z23 Encounter for immunization: Secondary | ICD-10-CM | POA: Diagnosis not present

## 2016-11-27 DIAGNOSIS — L409 Psoriasis, unspecified: Secondary | ICD-10-CM | POA: Diagnosis not present

## 2017-01-09 DIAGNOSIS — Z79899 Other long term (current) drug therapy: Secondary | ICD-10-CM | POA: Diagnosis not present

## 2017-01-09 DIAGNOSIS — L409 Psoriasis, unspecified: Secondary | ICD-10-CM | POA: Diagnosis not present

## 2017-01-09 DIAGNOSIS — D1801 Hemangioma of skin and subcutaneous tissue: Secondary | ICD-10-CM | POA: Diagnosis not present

## 2017-01-09 DIAGNOSIS — L814 Other melanin hyperpigmentation: Secondary | ICD-10-CM | POA: Diagnosis not present

## 2017-03-20 DIAGNOSIS — L409 Psoriasis, unspecified: Secondary | ICD-10-CM | POA: Diagnosis not present

## 2017-04-08 DIAGNOSIS — L409 Psoriasis, unspecified: Secondary | ICD-10-CM | POA: Diagnosis not present

## 2017-06-04 DIAGNOSIS — M9903 Segmental and somatic dysfunction of lumbar region: Secondary | ICD-10-CM | POA: Diagnosis not present

## 2017-06-04 DIAGNOSIS — M62830 Muscle spasm of back: Secondary | ICD-10-CM | POA: Diagnosis not present

## 2017-06-04 DIAGNOSIS — M9904 Segmental and somatic dysfunction of sacral region: Secondary | ICD-10-CM | POA: Diagnosis not present

## 2017-06-05 DIAGNOSIS — M62830 Muscle spasm of back: Secondary | ICD-10-CM | POA: Diagnosis not present

## 2017-06-05 DIAGNOSIS — M9903 Segmental and somatic dysfunction of lumbar region: Secondary | ICD-10-CM | POA: Diagnosis not present

## 2017-06-05 DIAGNOSIS — M9904 Segmental and somatic dysfunction of sacral region: Secondary | ICD-10-CM | POA: Diagnosis not present

## 2017-06-06 DIAGNOSIS — M9903 Segmental and somatic dysfunction of lumbar region: Secondary | ICD-10-CM | POA: Diagnosis not present

## 2017-06-06 DIAGNOSIS — M9904 Segmental and somatic dysfunction of sacral region: Secondary | ICD-10-CM | POA: Diagnosis not present

## 2017-06-06 DIAGNOSIS — M62830 Muscle spasm of back: Secondary | ICD-10-CM | POA: Diagnosis not present

## 2017-06-10 DIAGNOSIS — M62830 Muscle spasm of back: Secondary | ICD-10-CM | POA: Diagnosis not present

## 2017-06-10 DIAGNOSIS — M9904 Segmental and somatic dysfunction of sacral region: Secondary | ICD-10-CM | POA: Diagnosis not present

## 2017-06-10 DIAGNOSIS — M9903 Segmental and somatic dysfunction of lumbar region: Secondary | ICD-10-CM | POA: Diagnosis not present

## 2017-06-12 DIAGNOSIS — M62830 Muscle spasm of back: Secondary | ICD-10-CM | POA: Diagnosis not present

## 2017-06-12 DIAGNOSIS — M9903 Segmental and somatic dysfunction of lumbar region: Secondary | ICD-10-CM | POA: Diagnosis not present

## 2017-06-12 DIAGNOSIS — M9904 Segmental and somatic dysfunction of sacral region: Secondary | ICD-10-CM | POA: Diagnosis not present

## 2017-06-13 DIAGNOSIS — M9903 Segmental and somatic dysfunction of lumbar region: Secondary | ICD-10-CM | POA: Diagnosis not present

## 2017-06-13 DIAGNOSIS — M62830 Muscle spasm of back: Secondary | ICD-10-CM | POA: Diagnosis not present

## 2017-06-13 DIAGNOSIS — M9904 Segmental and somatic dysfunction of sacral region: Secondary | ICD-10-CM | POA: Diagnosis not present

## 2017-06-17 DIAGNOSIS — M62830 Muscle spasm of back: Secondary | ICD-10-CM | POA: Diagnosis not present

## 2017-06-17 DIAGNOSIS — M9903 Segmental and somatic dysfunction of lumbar region: Secondary | ICD-10-CM | POA: Diagnosis not present

## 2017-06-17 DIAGNOSIS — M9904 Segmental and somatic dysfunction of sacral region: Secondary | ICD-10-CM | POA: Diagnosis not present

## 2017-06-18 DIAGNOSIS — L409 Psoriasis, unspecified: Secondary | ICD-10-CM | POA: Diagnosis not present

## 2017-06-18 DIAGNOSIS — Z79899 Other long term (current) drug therapy: Secondary | ICD-10-CM | POA: Diagnosis not present

## 2017-06-19 DIAGNOSIS — M9904 Segmental and somatic dysfunction of sacral region: Secondary | ICD-10-CM | POA: Diagnosis not present

## 2017-06-19 DIAGNOSIS — M62830 Muscle spasm of back: Secondary | ICD-10-CM | POA: Diagnosis not present

## 2017-06-19 DIAGNOSIS — M9903 Segmental and somatic dysfunction of lumbar region: Secondary | ICD-10-CM | POA: Diagnosis not present

## 2017-06-20 DIAGNOSIS — M9904 Segmental and somatic dysfunction of sacral region: Secondary | ICD-10-CM | POA: Diagnosis not present

## 2017-06-20 DIAGNOSIS — M62830 Muscle spasm of back: Secondary | ICD-10-CM | POA: Diagnosis not present

## 2017-06-20 DIAGNOSIS — M9903 Segmental and somatic dysfunction of lumbar region: Secondary | ICD-10-CM | POA: Diagnosis not present

## 2017-06-26 DIAGNOSIS — M9903 Segmental and somatic dysfunction of lumbar region: Secondary | ICD-10-CM | POA: Diagnosis not present

## 2017-06-26 DIAGNOSIS — M9904 Segmental and somatic dysfunction of sacral region: Secondary | ICD-10-CM | POA: Diagnosis not present

## 2017-06-26 DIAGNOSIS — M62830 Muscle spasm of back: Secondary | ICD-10-CM | POA: Diagnosis not present

## 2017-06-27 DIAGNOSIS — M9904 Segmental and somatic dysfunction of sacral region: Secondary | ICD-10-CM | POA: Diagnosis not present

## 2017-06-27 DIAGNOSIS — M9903 Segmental and somatic dysfunction of lumbar region: Secondary | ICD-10-CM | POA: Diagnosis not present

## 2017-06-27 DIAGNOSIS — M62830 Muscle spasm of back: Secondary | ICD-10-CM | POA: Diagnosis not present

## 2017-07-01 DIAGNOSIS — M9904 Segmental and somatic dysfunction of sacral region: Secondary | ICD-10-CM | POA: Diagnosis not present

## 2017-07-01 DIAGNOSIS — M62830 Muscle spasm of back: Secondary | ICD-10-CM | POA: Diagnosis not present

## 2017-07-01 DIAGNOSIS — M9903 Segmental and somatic dysfunction of lumbar region: Secondary | ICD-10-CM | POA: Diagnosis not present

## 2017-07-02 DIAGNOSIS — Z86018 Personal history of other benign neoplasm: Secondary | ICD-10-CM | POA: Diagnosis not present

## 2017-07-02 DIAGNOSIS — D225 Melanocytic nevi of trunk: Secondary | ICD-10-CM | POA: Diagnosis not present

## 2017-07-02 DIAGNOSIS — D485 Neoplasm of uncertain behavior of skin: Secondary | ICD-10-CM | POA: Diagnosis not present

## 2017-07-02 DIAGNOSIS — D1800 Hemangioma unspecified site: Secondary | ICD-10-CM | POA: Diagnosis not present

## 2017-07-02 DIAGNOSIS — L409 Psoriasis, unspecified: Secondary | ICD-10-CM | POA: Diagnosis not present

## 2017-07-03 DIAGNOSIS — M9904 Segmental and somatic dysfunction of sacral region: Secondary | ICD-10-CM | POA: Diagnosis not present

## 2017-07-03 DIAGNOSIS — M9903 Segmental and somatic dysfunction of lumbar region: Secondary | ICD-10-CM | POA: Diagnosis not present

## 2017-07-03 DIAGNOSIS — M62830 Muscle spasm of back: Secondary | ICD-10-CM | POA: Diagnosis not present

## 2017-07-04 DIAGNOSIS — M62830 Muscle spasm of back: Secondary | ICD-10-CM | POA: Diagnosis not present

## 2017-07-04 DIAGNOSIS — M9903 Segmental and somatic dysfunction of lumbar region: Secondary | ICD-10-CM | POA: Diagnosis not present

## 2017-07-04 DIAGNOSIS — M9904 Segmental and somatic dysfunction of sacral region: Secondary | ICD-10-CM | POA: Diagnosis not present

## 2017-07-07 DIAGNOSIS — A499 Bacterial infection, unspecified: Secondary | ICD-10-CM | POA: Diagnosis not present

## 2017-07-07 DIAGNOSIS — N200 Calculus of kidney: Secondary | ICD-10-CM | POA: Diagnosis not present

## 2017-07-07 DIAGNOSIS — N390 Urinary tract infection, site not specified: Secondary | ICD-10-CM | POA: Diagnosis not present

## 2017-07-08 DIAGNOSIS — M9903 Segmental and somatic dysfunction of lumbar region: Secondary | ICD-10-CM | POA: Diagnosis not present

## 2017-07-08 DIAGNOSIS — M9904 Segmental and somatic dysfunction of sacral region: Secondary | ICD-10-CM | POA: Diagnosis not present

## 2017-07-08 DIAGNOSIS — M62830 Muscle spasm of back: Secondary | ICD-10-CM | POA: Diagnosis not present

## 2017-07-08 DIAGNOSIS — N3000 Acute cystitis without hematuria: Secondary | ICD-10-CM | POA: Diagnosis not present

## 2017-07-08 DIAGNOSIS — R109 Unspecified abdominal pain: Secondary | ICD-10-CM | POA: Diagnosis not present

## 2017-07-10 DIAGNOSIS — M9903 Segmental and somatic dysfunction of lumbar region: Secondary | ICD-10-CM | POA: Diagnosis not present

## 2017-07-10 DIAGNOSIS — M9904 Segmental and somatic dysfunction of sacral region: Secondary | ICD-10-CM | POA: Diagnosis not present

## 2017-07-10 DIAGNOSIS — M62830 Muscle spasm of back: Secondary | ICD-10-CM | POA: Diagnosis not present

## 2017-07-11 DIAGNOSIS — M9904 Segmental and somatic dysfunction of sacral region: Secondary | ICD-10-CM | POA: Diagnosis not present

## 2017-07-11 DIAGNOSIS — M9903 Segmental and somatic dysfunction of lumbar region: Secondary | ICD-10-CM | POA: Diagnosis not present

## 2017-07-11 DIAGNOSIS — M62830 Muscle spasm of back: Secondary | ICD-10-CM | POA: Diagnosis not present

## 2017-07-15 DIAGNOSIS — M9904 Segmental and somatic dysfunction of sacral region: Secondary | ICD-10-CM | POA: Diagnosis not present

## 2017-07-15 DIAGNOSIS — M9903 Segmental and somatic dysfunction of lumbar region: Secondary | ICD-10-CM | POA: Diagnosis not present

## 2017-07-15 DIAGNOSIS — M62830 Muscle spasm of back: Secondary | ICD-10-CM | POA: Diagnosis not present

## 2017-07-18 DIAGNOSIS — M62830 Muscle spasm of back: Secondary | ICD-10-CM | POA: Diagnosis not present

## 2017-07-18 DIAGNOSIS — M9903 Segmental and somatic dysfunction of lumbar region: Secondary | ICD-10-CM | POA: Diagnosis not present

## 2017-07-18 DIAGNOSIS — M9904 Segmental and somatic dysfunction of sacral region: Secondary | ICD-10-CM | POA: Diagnosis not present

## 2017-07-23 DIAGNOSIS — M62830 Muscle spasm of back: Secondary | ICD-10-CM | POA: Diagnosis not present

## 2017-07-23 DIAGNOSIS — M9903 Segmental and somatic dysfunction of lumbar region: Secondary | ICD-10-CM | POA: Diagnosis not present

## 2017-07-23 DIAGNOSIS — M9904 Segmental and somatic dysfunction of sacral region: Secondary | ICD-10-CM | POA: Diagnosis not present

## 2017-07-25 DIAGNOSIS — M9903 Segmental and somatic dysfunction of lumbar region: Secondary | ICD-10-CM | POA: Diagnosis not present

## 2017-07-25 DIAGNOSIS — M9904 Segmental and somatic dysfunction of sacral region: Secondary | ICD-10-CM | POA: Diagnosis not present

## 2017-07-25 DIAGNOSIS — M62830 Muscle spasm of back: Secondary | ICD-10-CM | POA: Diagnosis not present

## 2017-08-05 DIAGNOSIS — M9903 Segmental and somatic dysfunction of lumbar region: Secondary | ICD-10-CM | POA: Diagnosis not present

## 2017-08-05 DIAGNOSIS — M62830 Muscle spasm of back: Secondary | ICD-10-CM | POA: Diagnosis not present

## 2017-08-05 DIAGNOSIS — M9904 Segmental and somatic dysfunction of sacral region: Secondary | ICD-10-CM | POA: Diagnosis not present

## 2017-08-12 DIAGNOSIS — M9903 Segmental and somatic dysfunction of lumbar region: Secondary | ICD-10-CM | POA: Diagnosis not present

## 2017-08-12 DIAGNOSIS — M9904 Segmental and somatic dysfunction of sacral region: Secondary | ICD-10-CM | POA: Diagnosis not present

## 2017-08-12 DIAGNOSIS — M62830 Muscle spasm of back: Secondary | ICD-10-CM | POA: Diagnosis not present

## 2017-08-21 DIAGNOSIS — M62830 Muscle spasm of back: Secondary | ICD-10-CM | POA: Diagnosis not present

## 2017-08-21 DIAGNOSIS — M9903 Segmental and somatic dysfunction of lumbar region: Secondary | ICD-10-CM | POA: Diagnosis not present

## 2017-08-21 DIAGNOSIS — M9904 Segmental and somatic dysfunction of sacral region: Secondary | ICD-10-CM | POA: Diagnosis not present

## 2017-08-26 DIAGNOSIS — M9904 Segmental and somatic dysfunction of sacral region: Secondary | ICD-10-CM | POA: Diagnosis not present

## 2017-08-26 DIAGNOSIS — M62830 Muscle spasm of back: Secondary | ICD-10-CM | POA: Diagnosis not present

## 2017-08-26 DIAGNOSIS — M9903 Segmental and somatic dysfunction of lumbar region: Secondary | ICD-10-CM | POA: Diagnosis not present

## 2017-09-03 DIAGNOSIS — Z1231 Encounter for screening mammogram for malignant neoplasm of breast: Secondary | ICD-10-CM | POA: Diagnosis not present

## 2017-09-03 DIAGNOSIS — Z01419 Encounter for gynecological examination (general) (routine) without abnormal findings: Secondary | ICD-10-CM | POA: Diagnosis not present

## 2017-09-03 DIAGNOSIS — Z6822 Body mass index (BMI) 22.0-22.9, adult: Secondary | ICD-10-CM | POA: Diagnosis not present

## 2017-09-07 DIAGNOSIS — M5416 Radiculopathy, lumbar region: Secondary | ICD-10-CM | POA: Diagnosis not present

## 2017-09-15 DIAGNOSIS — M545 Low back pain: Secondary | ICD-10-CM | POA: Diagnosis not present

## 2017-09-17 DIAGNOSIS — M5416 Radiculopathy, lumbar region: Secondary | ICD-10-CM | POA: Diagnosis not present

## 2017-09-19 DIAGNOSIS — M5417 Radiculopathy, lumbosacral region: Secondary | ICD-10-CM | POA: Diagnosis not present

## 2017-09-19 DIAGNOSIS — M79605 Pain in left leg: Secondary | ICD-10-CM | POA: Diagnosis not present

## 2017-09-19 DIAGNOSIS — M545 Low back pain: Secondary | ICD-10-CM | POA: Diagnosis not present

## 2017-09-20 DIAGNOSIS — M5416 Radiculopathy, lumbar region: Secondary | ICD-10-CM | POA: Diagnosis not present

## 2017-09-23 DIAGNOSIS — M545 Low back pain: Secondary | ICD-10-CM | POA: Diagnosis not present

## 2017-09-23 DIAGNOSIS — M79605 Pain in left leg: Secondary | ICD-10-CM | POA: Diagnosis not present

## 2017-09-23 DIAGNOSIS — M5417 Radiculopathy, lumbosacral region: Secondary | ICD-10-CM | POA: Diagnosis not present

## 2017-09-26 DIAGNOSIS — M79605 Pain in left leg: Secondary | ICD-10-CM | POA: Diagnosis not present

## 2017-09-26 DIAGNOSIS — M545 Low back pain: Secondary | ICD-10-CM | POA: Diagnosis not present

## 2017-09-26 DIAGNOSIS — M5417 Radiculopathy, lumbosacral region: Secondary | ICD-10-CM | POA: Diagnosis not present

## 2017-10-03 DIAGNOSIS — M5416 Radiculopathy, lumbar region: Secondary | ICD-10-CM | POA: Diagnosis not present

## 2017-10-04 DIAGNOSIS — M5417 Radiculopathy, lumbosacral region: Secondary | ICD-10-CM | POA: Diagnosis not present

## 2017-10-04 DIAGNOSIS — M79605 Pain in left leg: Secondary | ICD-10-CM | POA: Diagnosis not present

## 2017-10-04 DIAGNOSIS — M545 Low back pain: Secondary | ICD-10-CM | POA: Diagnosis not present

## 2017-10-10 DIAGNOSIS — M79605 Pain in left leg: Secondary | ICD-10-CM | POA: Diagnosis not present

## 2017-10-10 DIAGNOSIS — M545 Low back pain: Secondary | ICD-10-CM | POA: Diagnosis not present

## 2017-10-10 DIAGNOSIS — M5417 Radiculopathy, lumbosacral region: Secondary | ICD-10-CM | POA: Diagnosis not present

## 2017-10-15 DIAGNOSIS — M5416 Radiculopathy, lumbar region: Secondary | ICD-10-CM | POA: Diagnosis not present

## 2017-10-23 DIAGNOSIS — M5417 Radiculopathy, lumbosacral region: Secondary | ICD-10-CM | POA: Diagnosis not present

## 2017-10-23 DIAGNOSIS — M545 Low back pain: Secondary | ICD-10-CM | POA: Diagnosis not present

## 2017-10-23 DIAGNOSIS — M79605 Pain in left leg: Secondary | ICD-10-CM | POA: Diagnosis not present

## 2017-10-31 DIAGNOSIS — M79605 Pain in left leg: Secondary | ICD-10-CM | POA: Diagnosis not present

## 2017-10-31 DIAGNOSIS — M5417 Radiculopathy, lumbosacral region: Secondary | ICD-10-CM | POA: Diagnosis not present

## 2017-10-31 DIAGNOSIS — M545 Low back pain: Secondary | ICD-10-CM | POA: Diagnosis not present

## 2017-11-08 DIAGNOSIS — M79605 Pain in left leg: Secondary | ICD-10-CM | POA: Diagnosis not present

## 2017-11-08 DIAGNOSIS — M5417 Radiculopathy, lumbosacral region: Secondary | ICD-10-CM | POA: Diagnosis not present

## 2017-11-08 DIAGNOSIS — M545 Low back pain: Secondary | ICD-10-CM | POA: Diagnosis not present

## 2017-11-14 DIAGNOSIS — M545 Low back pain: Secondary | ICD-10-CM | POA: Diagnosis not present

## 2017-11-14 DIAGNOSIS — M5417 Radiculopathy, lumbosacral region: Secondary | ICD-10-CM | POA: Diagnosis not present

## 2017-11-14 DIAGNOSIS — M79605 Pain in left leg: Secondary | ICD-10-CM | POA: Diagnosis not present

## 2017-11-27 DIAGNOSIS — M545 Low back pain: Secondary | ICD-10-CM | POA: Diagnosis not present

## 2017-11-27 DIAGNOSIS — M79605 Pain in left leg: Secondary | ICD-10-CM | POA: Diagnosis not present

## 2017-11-27 DIAGNOSIS — M5417 Radiculopathy, lumbosacral region: Secondary | ICD-10-CM | POA: Diagnosis not present

## 2017-12-04 DIAGNOSIS — M545 Low back pain: Secondary | ICD-10-CM | POA: Diagnosis not present

## 2017-12-04 DIAGNOSIS — M5417 Radiculopathy, lumbosacral region: Secondary | ICD-10-CM | POA: Diagnosis not present

## 2017-12-04 DIAGNOSIS — M79605 Pain in left leg: Secondary | ICD-10-CM | POA: Diagnosis not present

## 2017-12-16 DIAGNOSIS — M5417 Radiculopathy, lumbosacral region: Secondary | ICD-10-CM | POA: Diagnosis not present

## 2017-12-16 DIAGNOSIS — M79605 Pain in left leg: Secondary | ICD-10-CM | POA: Diagnosis not present

## 2017-12-16 DIAGNOSIS — M545 Low back pain: Secondary | ICD-10-CM | POA: Diagnosis not present

## 2018-01-01 DIAGNOSIS — M545 Low back pain: Secondary | ICD-10-CM | POA: Diagnosis not present

## 2018-01-01 DIAGNOSIS — M79605 Pain in left leg: Secondary | ICD-10-CM | POA: Diagnosis not present

## 2018-01-01 DIAGNOSIS — M5417 Radiculopathy, lumbosacral region: Secondary | ICD-10-CM | POA: Diagnosis not present

## 2018-01-02 DIAGNOSIS — L409 Psoriasis, unspecified: Secondary | ICD-10-CM | POA: Diagnosis not present

## 2018-01-02 DIAGNOSIS — D1800 Hemangioma unspecified site: Secondary | ICD-10-CM | POA: Diagnosis not present

## 2018-01-02 DIAGNOSIS — Z79899 Other long term (current) drug therapy: Secondary | ICD-10-CM | POA: Diagnosis not present

## 2018-01-02 DIAGNOSIS — Z86018 Personal history of other benign neoplasm: Secondary | ICD-10-CM | POA: Diagnosis not present

## 2018-01-13 DIAGNOSIS — Z0000 Encounter for general adult medical examination without abnormal findings: Secondary | ICD-10-CM | POA: Diagnosis not present

## 2018-01-13 DIAGNOSIS — Z23 Encounter for immunization: Secondary | ICD-10-CM | POA: Diagnosis not present

## 2018-01-13 DIAGNOSIS — M5416 Radiculopathy, lumbar region: Secondary | ICD-10-CM | POA: Diagnosis not present

## 2018-01-15 DIAGNOSIS — M79605 Pain in left leg: Secondary | ICD-10-CM | POA: Diagnosis not present

## 2018-01-15 DIAGNOSIS — M5417 Radiculopathy, lumbosacral region: Secondary | ICD-10-CM | POA: Diagnosis not present

## 2018-01-15 DIAGNOSIS — M545 Low back pain: Secondary | ICD-10-CM | POA: Diagnosis not present

## 2018-02-12 DIAGNOSIS — M5417 Radiculopathy, lumbosacral region: Secondary | ICD-10-CM | POA: Diagnosis not present

## 2018-02-12 DIAGNOSIS — M79605 Pain in left leg: Secondary | ICD-10-CM | POA: Diagnosis not present

## 2018-02-12 DIAGNOSIS — M545 Low back pain: Secondary | ICD-10-CM | POA: Diagnosis not present

## 2018-07-04 DIAGNOSIS — H1030 Unspecified acute conjunctivitis, unspecified eye: Secondary | ICD-10-CM | POA: Diagnosis not present

## 2018-07-21 DIAGNOSIS — Z86018 Personal history of other benign neoplasm: Secondary | ICD-10-CM | POA: Diagnosis not present

## 2018-07-21 DIAGNOSIS — D1800 Hemangioma unspecified site: Secondary | ICD-10-CM | POA: Diagnosis not present

## 2018-07-21 DIAGNOSIS — Z79899 Other long term (current) drug therapy: Secondary | ICD-10-CM | POA: Diagnosis not present

## 2018-07-21 DIAGNOSIS — L409 Psoriasis, unspecified: Secondary | ICD-10-CM | POA: Diagnosis not present

## 2018-07-24 ENCOUNTER — Other Ambulatory Visit (HOSPITAL_COMMUNITY): Payer: Self-pay | Admitting: Orthopedic Surgery

## 2018-07-24 DIAGNOSIS — M5416 Radiculopathy, lumbar region: Secondary | ICD-10-CM

## 2018-09-05 DIAGNOSIS — M9903 Segmental and somatic dysfunction of lumbar region: Secondary | ICD-10-CM | POA: Diagnosis not present

## 2018-09-05 DIAGNOSIS — M62830 Muscle spasm of back: Secondary | ICD-10-CM | POA: Diagnosis not present

## 2018-09-05 DIAGNOSIS — M5416 Radiculopathy, lumbar region: Secondary | ICD-10-CM | POA: Diagnosis not present

## 2018-09-10 DIAGNOSIS — M79605 Pain in left leg: Secondary | ICD-10-CM | POA: Diagnosis not present

## 2018-09-10 DIAGNOSIS — M545 Low back pain: Secondary | ICD-10-CM | POA: Diagnosis not present

## 2018-09-10 DIAGNOSIS — M5417 Radiculopathy, lumbosacral region: Secondary | ICD-10-CM | POA: Diagnosis not present

## 2018-09-17 DIAGNOSIS — Z6821 Body mass index (BMI) 21.0-21.9, adult: Secondary | ICD-10-CM | POA: Diagnosis not present

## 2018-09-17 DIAGNOSIS — Z1231 Encounter for screening mammogram for malignant neoplasm of breast: Secondary | ICD-10-CM | POA: Diagnosis not present

## 2018-09-17 DIAGNOSIS — Z01419 Encounter for gynecological examination (general) (routine) without abnormal findings: Secondary | ICD-10-CM | POA: Diagnosis not present

## 2019-02-03 DIAGNOSIS — L814 Other melanin hyperpigmentation: Secondary | ICD-10-CM | POA: Diagnosis not present

## 2019-02-03 DIAGNOSIS — D2272 Melanocytic nevi of left lower limb, including hip: Secondary | ICD-10-CM | POA: Diagnosis not present

## 2019-02-03 DIAGNOSIS — L409 Psoriasis, unspecified: Secondary | ICD-10-CM | POA: Diagnosis not present

## 2019-02-03 DIAGNOSIS — Z79899 Other long term (current) drug therapy: Secondary | ICD-10-CM | POA: Diagnosis not present

## 2019-02-03 DIAGNOSIS — Z86018 Personal history of other benign neoplasm: Secondary | ICD-10-CM | POA: Diagnosis not present

## 2019-02-03 DIAGNOSIS — D485 Neoplasm of uncertain behavior of skin: Secondary | ICD-10-CM | POA: Diagnosis not present

## 2019-11-13 DIAGNOSIS — Z20828 Contact with and (suspected) exposure to other viral communicable diseases: Secondary | ICD-10-CM | POA: Diagnosis not present

## 2019-11-27 HISTORY — PX: VAGINAL HYSTERECTOMY: SUR661

## 2019-12-18 DIAGNOSIS — N946 Dysmenorrhea, unspecified: Secondary | ICD-10-CM | POA: Diagnosis not present

## 2019-12-18 DIAGNOSIS — D259 Leiomyoma of uterus, unspecified: Secondary | ICD-10-CM | POA: Diagnosis not present

## 2019-12-18 DIAGNOSIS — N920 Excessive and frequent menstruation with regular cycle: Secondary | ICD-10-CM | POA: Diagnosis not present

## 2019-12-23 ENCOUNTER — Other Ambulatory Visit: Payer: Self-pay | Admitting: Obstetrics and Gynecology

## 2019-12-23 DIAGNOSIS — D252 Subserosal leiomyoma of uterus: Secondary | ICD-10-CM | POA: Diagnosis not present

## 2019-12-23 DIAGNOSIS — D251 Intramural leiomyoma of uterus: Secondary | ICD-10-CM | POA: Diagnosis not present

## 2019-12-23 DIAGNOSIS — N945 Secondary dysmenorrhea: Secondary | ICD-10-CM | POA: Diagnosis not present

## 2019-12-23 DIAGNOSIS — N920 Excessive and frequent menstruation with regular cycle: Secondary | ICD-10-CM | POA: Diagnosis not present

## 2019-12-23 DIAGNOSIS — D259 Leiomyoma of uterus, unspecified: Secondary | ICD-10-CM | POA: Diagnosis not present

## 2020-03-15 DIAGNOSIS — N1831 Chronic kidney disease, stage 3a: Secondary | ICD-10-CM | POA: Diagnosis not present

## 2020-03-15 DIAGNOSIS — E673 Hypervitaminosis D: Secondary | ICD-10-CM | POA: Diagnosis not present

## 2020-03-23 DIAGNOSIS — L814 Other melanin hyperpigmentation: Secondary | ICD-10-CM | POA: Diagnosis not present

## 2020-03-23 DIAGNOSIS — Z86018 Personal history of other benign neoplasm: Secondary | ICD-10-CM | POA: Diagnosis not present

## 2020-03-23 DIAGNOSIS — Z79899 Other long term (current) drug therapy: Secondary | ICD-10-CM | POA: Diagnosis not present

## 2020-03-23 DIAGNOSIS — D225 Melanocytic nevi of trunk: Secondary | ICD-10-CM | POA: Diagnosis not present

## 2020-03-23 DIAGNOSIS — L409 Psoriasis, unspecified: Secondary | ICD-10-CM | POA: Diagnosis not present

## 2020-07-01 DIAGNOSIS — M5416 Radiculopathy, lumbar region: Secondary | ICD-10-CM | POA: Diagnosis not present

## 2020-07-01 DIAGNOSIS — M545 Low back pain: Secondary | ICD-10-CM | POA: Diagnosis not present

## 2020-07-05 DIAGNOSIS — M545 Low back pain: Secondary | ICD-10-CM | POA: Diagnosis not present

## 2020-07-08 DIAGNOSIS — M5416 Radiculopathy, lumbar region: Secondary | ICD-10-CM | POA: Diagnosis not present

## 2020-07-19 DIAGNOSIS — M25512 Pain in left shoulder: Secondary | ICD-10-CM | POA: Diagnosis not present

## 2020-07-19 DIAGNOSIS — M25612 Stiffness of left shoulder, not elsewhere classified: Secondary | ICD-10-CM | POA: Diagnosis not present

## 2020-07-26 DIAGNOSIS — M25612 Stiffness of left shoulder, not elsewhere classified: Secondary | ICD-10-CM | POA: Diagnosis not present

## 2020-07-26 DIAGNOSIS — M5416 Radiculopathy, lumbar region: Secondary | ICD-10-CM | POA: Diagnosis not present

## 2020-07-26 DIAGNOSIS — M25512 Pain in left shoulder: Secondary | ICD-10-CM | POA: Diagnosis not present

## 2020-08-04 DIAGNOSIS — M25612 Stiffness of left shoulder, not elsewhere classified: Secondary | ICD-10-CM | POA: Diagnosis not present

## 2020-08-04 DIAGNOSIS — M25512 Pain in left shoulder: Secondary | ICD-10-CM | POA: Diagnosis not present

## 2020-08-18 DIAGNOSIS — M5416 Radiculopathy, lumbar region: Secondary | ICD-10-CM | POA: Diagnosis not present

## 2020-08-19 DIAGNOSIS — M25512 Pain in left shoulder: Secondary | ICD-10-CM | POA: Diagnosis not present

## 2020-08-19 DIAGNOSIS — M25612 Stiffness of left shoulder, not elsewhere classified: Secondary | ICD-10-CM | POA: Diagnosis not present

## 2020-08-29 DIAGNOSIS — M25612 Stiffness of left shoulder, not elsewhere classified: Secondary | ICD-10-CM | POA: Diagnosis not present

## 2020-08-29 DIAGNOSIS — M25512 Pain in left shoulder: Secondary | ICD-10-CM | POA: Diagnosis not present

## 2020-08-30 DIAGNOSIS — Z23 Encounter for immunization: Secondary | ICD-10-CM | POA: Diagnosis not present

## 2020-08-30 DIAGNOSIS — Z79899 Other long term (current) drug therapy: Secondary | ICD-10-CM | POA: Diagnosis not present

## 2020-08-30 DIAGNOSIS — E673 Hypervitaminosis D: Secondary | ICD-10-CM | POA: Diagnosis not present

## 2020-09-02 DIAGNOSIS — M5416 Radiculopathy, lumbar region: Secondary | ICD-10-CM | POA: Diagnosis not present

## 2020-09-05 DIAGNOSIS — M25612 Stiffness of left shoulder, not elsewhere classified: Secondary | ICD-10-CM | POA: Diagnosis not present

## 2020-09-05 DIAGNOSIS — M25512 Pain in left shoulder: Secondary | ICD-10-CM | POA: Diagnosis not present

## 2020-09-12 DIAGNOSIS — M25512 Pain in left shoulder: Secondary | ICD-10-CM | POA: Diagnosis not present

## 2020-09-12 DIAGNOSIS — M25612 Stiffness of left shoulder, not elsewhere classified: Secondary | ICD-10-CM | POA: Diagnosis not present

## 2020-09-23 DIAGNOSIS — M25612 Stiffness of left shoulder, not elsewhere classified: Secondary | ICD-10-CM | POA: Diagnosis not present

## 2020-09-23 DIAGNOSIS — M25512 Pain in left shoulder: Secondary | ICD-10-CM | POA: Diagnosis not present

## 2020-09-30 DIAGNOSIS — M25612 Stiffness of left shoulder, not elsewhere classified: Secondary | ICD-10-CM | POA: Diagnosis not present

## 2020-09-30 DIAGNOSIS — M25512 Pain in left shoulder: Secondary | ICD-10-CM | POA: Diagnosis not present

## 2020-10-07 DIAGNOSIS — M25512 Pain in left shoulder: Secondary | ICD-10-CM | POA: Diagnosis not present

## 2020-10-07 DIAGNOSIS — M25612 Stiffness of left shoulder, not elsewhere classified: Secondary | ICD-10-CM | POA: Diagnosis not present

## 2020-10-13 DIAGNOSIS — M25612 Stiffness of left shoulder, not elsewhere classified: Secondary | ICD-10-CM | POA: Diagnosis not present

## 2020-10-13 DIAGNOSIS — M25512 Pain in left shoulder: Secondary | ICD-10-CM | POA: Diagnosis not present

## 2020-10-28 DIAGNOSIS — M25512 Pain in left shoulder: Secondary | ICD-10-CM | POA: Diagnosis not present

## 2020-10-28 DIAGNOSIS — M25612 Stiffness of left shoulder, not elsewhere classified: Secondary | ICD-10-CM | POA: Diagnosis not present

## 2020-11-07 DIAGNOSIS — L409 Psoriasis, unspecified: Secondary | ICD-10-CM | POA: Diagnosis not present

## 2020-11-07 DIAGNOSIS — L578 Other skin changes due to chronic exposure to nonionizing radiation: Secondary | ICD-10-CM | POA: Diagnosis not present

## 2020-11-07 DIAGNOSIS — Z79899 Other long term (current) drug therapy: Secondary | ICD-10-CM | POA: Diagnosis not present

## 2020-11-07 DIAGNOSIS — D225 Melanocytic nevi of trunk: Secondary | ICD-10-CM | POA: Diagnosis not present

## 2020-11-07 DIAGNOSIS — Z86018 Personal history of other benign neoplasm: Secondary | ICD-10-CM | POA: Diagnosis not present

## 2020-11-11 DIAGNOSIS — M25612 Stiffness of left shoulder, not elsewhere classified: Secondary | ICD-10-CM | POA: Diagnosis not present

## 2020-11-11 DIAGNOSIS — M25512 Pain in left shoulder: Secondary | ICD-10-CM | POA: Diagnosis not present

## 2020-12-08 DIAGNOSIS — Z1152 Encounter for screening for COVID-19: Secondary | ICD-10-CM | POA: Diagnosis not present

## 2020-12-09 DIAGNOSIS — Z1231 Encounter for screening mammogram for malignant neoplasm of breast: Secondary | ICD-10-CM | POA: Diagnosis not present

## 2020-12-09 DIAGNOSIS — Z6822 Body mass index (BMI) 22.0-22.9, adult: Secondary | ICD-10-CM | POA: Diagnosis not present

## 2020-12-09 DIAGNOSIS — Z01419 Encounter for gynecological examination (general) (routine) without abnormal findings: Secondary | ICD-10-CM | POA: Diagnosis not present

## 2021-05-01 DIAGNOSIS — L409 Psoriasis, unspecified: Secondary | ICD-10-CM | POA: Diagnosis not present

## 2021-05-01 DIAGNOSIS — Z79899 Other long term (current) drug therapy: Secondary | ICD-10-CM | POA: Diagnosis not present

## 2021-05-01 DIAGNOSIS — L814 Other melanin hyperpigmentation: Secondary | ICD-10-CM | POA: Diagnosis not present

## 2021-05-01 DIAGNOSIS — D225 Melanocytic nevi of trunk: Secondary | ICD-10-CM | POA: Diagnosis not present

## 2021-05-01 DIAGNOSIS — D485 Neoplasm of uncertain behavior of skin: Secondary | ICD-10-CM | POA: Diagnosis not present

## 2021-05-01 DIAGNOSIS — Z86018 Personal history of other benign neoplasm: Secondary | ICD-10-CM | POA: Diagnosis not present

## 2021-06-07 DIAGNOSIS — D485 Neoplasm of uncertain behavior of skin: Secondary | ICD-10-CM | POA: Diagnosis not present

## 2021-07-03 DIAGNOSIS — Z13220 Encounter for screening for lipoid disorders: Secondary | ICD-10-CM | POA: Diagnosis not present

## 2021-07-03 DIAGNOSIS — Z79899 Other long term (current) drug therapy: Secondary | ICD-10-CM | POA: Diagnosis not present

## 2021-07-03 DIAGNOSIS — Z0000 Encounter for general adult medical examination without abnormal findings: Secondary | ICD-10-CM | POA: Diagnosis not present

## 2021-07-13 DIAGNOSIS — K130 Diseases of lips: Secondary | ICD-10-CM | POA: Diagnosis not present

## 2021-10-30 DIAGNOSIS — Z86018 Personal history of other benign neoplasm: Secondary | ICD-10-CM | POA: Diagnosis not present

## 2021-10-30 DIAGNOSIS — L409 Psoriasis, unspecified: Secondary | ICD-10-CM | POA: Diagnosis not present

## 2021-10-30 DIAGNOSIS — D225 Melanocytic nevi of trunk: Secondary | ICD-10-CM | POA: Diagnosis not present

## 2021-10-30 DIAGNOSIS — L814 Other melanin hyperpigmentation: Secondary | ICD-10-CM | POA: Diagnosis not present

## 2021-12-25 DIAGNOSIS — Z1231 Encounter for screening mammogram for malignant neoplasm of breast: Secondary | ICD-10-CM | POA: Diagnosis not present

## 2021-12-25 DIAGNOSIS — Z6822 Body mass index (BMI) 22.0-22.9, adult: Secondary | ICD-10-CM | POA: Diagnosis not present

## 2021-12-25 DIAGNOSIS — Z01419 Encounter for gynecological examination (general) (routine) without abnormal findings: Secondary | ICD-10-CM | POA: Diagnosis not present

## 2021-12-27 ENCOUNTER — Other Ambulatory Visit: Payer: Self-pay | Admitting: Obstetrics and Gynecology

## 2021-12-27 DIAGNOSIS — R928 Other abnormal and inconclusive findings on diagnostic imaging of breast: Secondary | ICD-10-CM

## 2022-01-06 ENCOUNTER — Ambulatory Visit
Admission: RE | Admit: 2022-01-06 | Discharge: 2022-01-06 | Disposition: A | Discharge status: Home / Self Care | Payer: BLUE CROSS/BLUE SHIELD | Source: Ambulatory Visit | Attending: Obstetrics and Gynecology | Admitting: Obstetrics and Gynecology

## 2022-01-06 ENCOUNTER — Other Ambulatory Visit: Payer: Self-pay | Admitting: Obstetrics and Gynecology

## 2022-01-06 DIAGNOSIS — R921 Mammographic calcification found on diagnostic imaging of breast: Secondary | ICD-10-CM | POA: Diagnosis not present

## 2022-01-06 DIAGNOSIS — R922 Inconclusive mammogram: Secondary | ICD-10-CM | POA: Diagnosis not present

## 2022-01-06 DIAGNOSIS — R928 Other abnormal and inconclusive findings on diagnostic imaging of breast: Secondary | ICD-10-CM

## 2022-04-03 ENCOUNTER — Encounter: Payer: Self-pay | Admitting: Internal Medicine

## 2022-05-02 ENCOUNTER — Ambulatory Visit (AMBULATORY_SURGERY_CENTER): Payer: Self-pay | Admitting: *Deleted

## 2022-05-02 VITALS — Ht 67.0 in | Wt 141.0 lb

## 2022-05-02 DIAGNOSIS — L814 Other melanin hyperpigmentation: Secondary | ICD-10-CM | POA: Diagnosis not present

## 2022-05-02 DIAGNOSIS — Z1211 Encounter for screening for malignant neoplasm of colon: Secondary | ICD-10-CM

## 2022-05-02 DIAGNOSIS — L409 Psoriasis, unspecified: Secondary | ICD-10-CM | POA: Diagnosis not present

## 2022-05-02 DIAGNOSIS — L578 Other skin changes due to chronic exposure to nonionizing radiation: Secondary | ICD-10-CM | POA: Diagnosis not present

## 2022-05-02 DIAGNOSIS — D2271 Melanocytic nevi of right lower limb, including hip: Secondary | ICD-10-CM | POA: Diagnosis not present

## 2022-05-02 DIAGNOSIS — Z79899 Other long term (current) drug therapy: Secondary | ICD-10-CM | POA: Diagnosis not present

## 2022-05-02 DIAGNOSIS — D225 Melanocytic nevi of trunk: Secondary | ICD-10-CM | POA: Diagnosis not present

## 2022-05-02 MED ORDER — NA SULFATE-K SULFATE-MG SULF 17.5-3.13-1.6 GM/177ML PO SOLN: 2.0000 | Freq: Once | ORAL | 0 refills | Status: AC

## 2022-05-02 NOTE — Progress Notes (Signed)
No egg or soy allergy known to patient  No issues known to pt with past sedation with any surgeries or procedures Patient denies ever being told they had issues or difficulty with intubation  No FH of Malignant Hyperthermia Pt is not on diet pills Pt is not on  home 02  Pt is not on blood thinners  Pt denies issues with constipation  No A fib or A flutter  Pt instructed to use Singlecare.com or GoodRx for a price reduction on prep   PV completed in person. Pt verified name, DOB, address and insurance during PV today.  Pt given instruction packet with copy of consent form to read and sign after procedure and instructions explained and questions answered. Pt encouraged to call with questions or issues.

## 2022-05-23 ENCOUNTER — Encounter: Payer: Self-pay | Admitting: Internal Medicine

## 2022-05-30 ENCOUNTER — Ambulatory Visit (AMBULATORY_SURGERY_CENTER): Payer: BC Managed Care – PPO | Admitting: Internal Medicine

## 2022-05-30 ENCOUNTER — Encounter: Payer: Self-pay | Admitting: Internal Medicine

## 2022-05-30 VITALS — BP 93/67 | HR 61 | Temp 98.4°F | Resp 12 | Ht 67.0 in | Wt 141.0 lb

## 2022-05-30 DIAGNOSIS — Z1211 Encounter for screening for malignant neoplasm of colon: Secondary | ICD-10-CM

## 2022-05-30 MED ORDER — SODIUM CHLORIDE 0.9 % IV SOLN: 500.0000 mL | Freq: Once | INTRAVENOUS | Status: DC

## 2022-05-30 NOTE — Patient Instructions (Signed)
YOU HAD AN ENDOSCOPIC PROCEDURE TODAY AT THE [facility]:   Refer to the procedure report that was given to you for any specific questions about what was found during the examination.  If the procedure report does not answer your questions, please call your gastroenterologist to clarify.  If you requested that your care partner not be given the details of your procedure findings, then the procedure report has been included in a sealed envelope for you to review at your convenience later.  YOU SHOULD EXPECT: Some feelings of bloating in the abdomen. Passage of more gas than usual.  Walking can help get rid of the air that was put into your GI tract during the procedure and reduce the bloating. If you had a lower endoscopy (such as a colonoscopy or flexible sigmoidoscopy) you may notice spotting of blood in your stool or on the toilet paper. If you underwent a bowel prep for your procedure, you may not have a normal bowel movement for a few days.  Please Note:  You might notice some irritation and congestion in your nose or some drainage.  This is from the oxygen used during your procedure.  There is no need for concern and it should clear up in a day or so.  SYMPTOMS TO REPORT IMMEDIATELY:  Following lower endoscopy (colonoscopy or flexible sigmoidoscopy):  Excessive amounts of blood in the stool  Significant tenderness or worsening of abdominal pains  Swelling of the abdomen that is new, acute  Fever of 100F or higher  For urgent or emergent issues, a gastroenterologist can be reached at any hour by calling [phone]. Do not use MyChart messaging for urgent concerns.    DIET:  We do recommend a small meal at first, but then you may proceed to your regular diet.  Drink plenty of fluids but you should avoid alcoholic beverages for 24 hours.  ACTIVITY:  You should plan to take it easy for the rest of today and you should NOT DRIVE or use heavy machinery until tomorrow (because of  the sedation medicines used during the test).    FOLLOW UP: Our staff will call the number listed on your records the next business day following your procedure.  We will call around 7:15- 8:00 am to check on you and address any questions or concerns that you may have regarding the information given to you following your procedure. If we do not reach you, we will leave a message.  If you develop any symptoms (ie: fever, flu-like symptoms, shortness of breath, cough etc.) before then, please call [phone].  If you test positive for Covid 19 in the 2 weeks post procedure, please call and report this information to us.    If any biopsies were taken you will be contacted by phone or by letter within the next 1-3 weeks.  Please call us at [phone] if you have not heard about the biopsies in 3 weeks.    SIGNATURES/CONFIDENTIALITY: You and/or your care partner have signed paperwork which will be entered into your electronic medical record.  These signatures attest to the fact that that the information above on your After Visit Summary has been reviewed and is understood.  Full responsibility of the confidentiality of this discharge information lies with you and/or your care-partner.  

## 2022-05-30 NOTE — Progress Notes (Signed)
GASTROENTEROLOGY PROCEDURE H&P NOTE   Primary Care Physician: Cari Caraway, MD    Reason for Procedure:   Screening for colon cancer  Plan:   colonoscopy  Patient is appropriate for endoscopic procedure(s) in the ambulatory ([place]) setting.  The nature of the procedure, as well as the risks, benefits, and alternatives were carefully and thoroughly reviewed with the patient. Ample time for discussion and questions allowed. The patient understood, was satisfied, and agreed to proceed.     HPI: Jessica Graves is a 46 y.o. female who presents for screening colonoscopy.  Medical history as below.  Tolerated the prep.  No recent chest pain or shortness of breath.  No abdominal pain today.  History reviewed. No pertinent past medical history.  Past Surgical History:  Procedure Laterality Date   CESAREAN SECTION  2011   LAPAROSCOPY  1998   VAGINAL HYSTERECTOMY  2021    Prior to Admission medications   Medication Sig Start Date End Date Taking? Authorizing Provider  Cholecalciferol (VITAMIN D3) 10 MCG (400 UNIT) CAPS Take by mouth.   Yes [provider]  Magnesium 250 MG TABS 2 tablets 03/21/20  Yes [provider]  Probiotic Product (PROBIOTIC-10 PO) Take by mouth.   Yes [provider]  ustekinumab (STELARA) 45 MG/0.5ML SOSY injection Stelara 45 mg/0.5 mL subcutaneous syringe    [provider]    Current Outpatient Medications  Medication Sig Dispense Refill   Cholecalciferol (VITAMIN D3) 10 MCG (400 UNIT) CAPS Take by mouth.     Magnesium 250 MG TABS 2 tablets     Probiotic Product (PROBIOTIC-10 PO) Take by mouth.     ustekinumab (STELARA) 45 MG/0.5ML SOSY injection Stelara 45 mg/0.5 mL subcutaneous syringe     Current Facility-Administered Medications  Medication Dose Route Frequency Provider Last Rate Last Admin   0.9 %  sodium chloride infusion  500 mL Intravenous Once Marsalis Beaulieu, Lajuan Lines, MD        Allergies as of 05/30/2022 - Review  Complete 05/30/2022  Allergen Reaction Noted   Clarithromycin Rash 05/02/2022   Latex Rash 05/02/2022   Codeine Nausea And Vomiting 05/02/2022   Penicillins  05/02/2022   Sulfa antibiotics  05/02/2022    Family History  Problem Relation Age of Onset   Colon polyps Mother    Colon cancer Neg Hx    Esophageal cancer Neg Hx    Stomach cancer Neg Hx    Rectal cancer Neg Hx     Social History   Socioeconomic History   Marital status: Married    Spouse name: Not on file   Number of children: Not on file   Years of education: Not on file   Highest education level: Not on file  Occupational History   Not on file  Tobacco Use   Smoking status: Never   Smokeless tobacco: Never  Vaping Use   Vaping Use: Never used  Substance and Sexual Activity   Alcohol use: Not on file    Comment: occasional   Drug use: Not Currently   Sexual activity: Not on file  Other Topics Concern   Not on file  Social History Narrative   Not on file   Social Determinants of Health   Financial Resource Strain: Not on file  Food Insecurity: Not on file  Transportation Needs: Not on file  Physical Activity: Not on file  Stress: Not on file  Social Connections: Not on file  Intimate Partner Violence: Not on file  Physical Exam: Vital signs in last 24 hours: '@BP'$  113/79   Pulse 70   Temp 98.4 F (36.9 C)   Ht '5\' 7"'$  (1.702 m)   Wt 141 lb (64 kg)   SpO2 97%   BMI 22.08 kg/m  GEN: NAD EYE: Sclerae anicteric ENT: MMM CV: Non-tachycardic Pulm: CTA b/l GI: Soft, NT/ND NEURO:  Alert & Oriented x 3   Zenovia Jarred, MD [facility]  05/30/2022 9:01 AM

## 2022-05-30 NOTE — Progress Notes (Signed)
Pt's states no medical or surgical changes since previsit or office visit. 

## 2022-05-30 NOTE — Progress Notes (Signed)
Report to PACU, RN, vss, BBS= Clear.  

## 2022-05-30 NOTE — Op Note (Signed)
[place] Patient Name: Jessica Graves Procedure Date: 05/30/2022 8:58 AM MRN: [id number] Endoscopist: Jerene Bears , MD Age: 46 Referring MD:  Date of Birth: 03-09-1976 Gender: Female Account #: [id number] Procedure:                Colonoscopy Indications:              Screening for colorectal malignant neoplasm, This                            is the patient's first colonoscopy Medicines:                Monitored Anesthesia Care Procedure:                Pre-Anesthesia Assessment:                           - Prior to the procedure, a History and Physical                            was performed, and patient medications and                            allergies were reviewed. The patient's tolerance of                            previous anesthesia was also reviewed. The risks                            and benefits of the procedure and the sedation                            options and risks were discussed with the patient.                            All questions were answered, and informed consent                            was obtained. Prior Anticoagulants: The patient has                            taken no previous anticoagulant or antiplatelet                            agents. ASA Grade Assessment: I - A normal, healthy                            patient. After reviewing the risks and benefits,                            the patient was deemed in satisfactory condition to                            undergo the procedure.  After obtaining informed consent, the colonoscope                            was passed under direct vision. Throughout the                            procedure, the patient's blood pressure, pulse, and                            oxygen saturations were monitored continuously. The                            Olympus PCF-H190DL (#[id number]) Colonoscope was                            introduced through the anus and advanced  to the                            cecum, identified by appendiceal orifice and                            ileocecal valve. The colonoscopy was performed                            without difficulty. The patient tolerated the                            procedure well. The quality of the bowel                            preparation was good. The ileocecal valve,                            appendiceal orifice, and rectum were photographed. Scope In: 9:09:00 AM Scope Out: 9:23:57 AM Scope Withdrawal Time: 0 hours 11 minutes 59 seconds  Total Procedure Duration: 0 hours 14 minutes 57 seconds  Findings:                 Hemorrhoids were found on perianal exam.                           A few small-mouthed diverticula were found in the                            sigmoid colon.                           External hemorrhoids were found during retroflexion                            and during digital exam.                           The exam was otherwise without abnormality. Complications:            No immediate complications. Estimated Blood  Loss:     Estimated blood loss: none. Impression:               - Mild diverticulosis in the sigmoid colon.                           - External hemorrhoids.                           - The examination was otherwise normal. No colon                            polyps seen.                           - No specimens collected. Recommendation:           - Patient has a contact number available for                            emergencies. The signs and symptoms of potential                            delayed complications were discussed with the                            patient. Return to normal activities tomorrow.                            Written discharge instructions were provided to the                            patient.                           - Resume previous diet.                           - Continue present medications.                            - Repeat colonoscopy in 10 years for screening                            purposes. Jerene Bears, MD 05/30/2022 9:32:51 AM This report has been signed electronically.

## 2022-05-31 ENCOUNTER — Telehealth: Payer: Self-pay | Admitting: *Deleted

## 2022-05-31 NOTE — Telephone Encounter (Signed)
Attempt for follow up phone call. No answer at number given.  Left message on voicemail.   

## 2022-07-04 DIAGNOSIS — M5416 Radiculopathy, lumbar region: Secondary | ICD-10-CM | POA: Diagnosis not present

## 2022-07-09 ENCOUNTER — Ambulatory Visit
Admission: RE | Admit: 2022-07-09 | Discharge: 2022-07-09 | Disposition: A | Discharge status: Home / Self Care | Payer: BC Managed Care – PPO | Source: Ambulatory Visit | Attending: Obstetrics and Gynecology | Admitting: Obstetrics and Gynecology

## 2022-07-09 ENCOUNTER — Other Ambulatory Visit: Payer: Self-pay | Admitting: Obstetrics and Gynecology

## 2022-07-09 DIAGNOSIS — R921 Mammographic calcification found on diagnostic imaging of breast: Secondary | ICD-10-CM

## 2022-07-25 DIAGNOSIS — M5416 Radiculopathy, lumbar region: Secondary | ICD-10-CM | POA: Diagnosis not present

## 2022-07-25 DIAGNOSIS — M79605 Pain in left leg: Secondary | ICD-10-CM | POA: Diagnosis not present

## 2022-07-25 DIAGNOSIS — M545 Low back pain, unspecified: Secondary | ICD-10-CM | POA: Diagnosis not present

## 2022-07-26 DIAGNOSIS — M5416 Radiculopathy, lumbar region: Secondary | ICD-10-CM | POA: Diagnosis not present

## 2022-12-11 DIAGNOSIS — L409 Psoriasis, unspecified: Secondary | ICD-10-CM | POA: Diagnosis not present

## 2022-12-11 DIAGNOSIS — D225 Melanocytic nevi of trunk: Secondary | ICD-10-CM | POA: Diagnosis not present

## 2022-12-11 DIAGNOSIS — Z86018 Personal history of other benign neoplasm: Secondary | ICD-10-CM | POA: Diagnosis not present

## 2022-12-11 DIAGNOSIS — L814 Other melanin hyperpigmentation: Secondary | ICD-10-CM | POA: Diagnosis not present

## 2023-01-02 DIAGNOSIS — Z6823 Body mass index (BMI) 23.0-23.9, adult: Secondary | ICD-10-CM | POA: Diagnosis not present

## 2023-01-02 DIAGNOSIS — Z01419 Encounter for gynecological examination (general) (routine) without abnormal findings: Secondary | ICD-10-CM | POA: Diagnosis not present

## 2023-01-10 ENCOUNTER — Ambulatory Visit
Admission: RE | Admit: 2023-01-10 | Discharge: 2023-01-10 | Disposition: A | Discharge status: Home / Self Care | Payer: BC Managed Care – PPO | Source: Ambulatory Visit | Attending: Obstetrics and Gynecology | Admitting: Obstetrics and Gynecology

## 2023-01-10 DIAGNOSIS — R921 Mammographic calcification found on diagnostic imaging of breast: Secondary | ICD-10-CM

## 2023-05-08 DIAGNOSIS — Z86018 Personal history of other benign neoplasm: Secondary | ICD-10-CM | POA: Diagnosis not present

## 2023-05-08 DIAGNOSIS — Z79899 Other long term (current) drug therapy: Secondary | ICD-10-CM | POA: Diagnosis not present

## 2023-05-08 DIAGNOSIS — D225 Melanocytic nevi of trunk: Secondary | ICD-10-CM | POA: Diagnosis not present

## 2023-05-08 DIAGNOSIS — L814 Other melanin hyperpigmentation: Secondary | ICD-10-CM | POA: Diagnosis not present

## 2023-05-08 DIAGNOSIS — L409 Psoriasis, unspecified: Secondary | ICD-10-CM | POA: Diagnosis not present

## 2023-05-09 DIAGNOSIS — L409 Psoriasis, unspecified: Secondary | ICD-10-CM | POA: Diagnosis not present

## 2023-05-09 DIAGNOSIS — Z79899 Other long term (current) drug therapy: Secondary | ICD-10-CM | POA: Diagnosis not present

## 2024-01-03 ENCOUNTER — Other Ambulatory Visit: Payer: Self-pay | Admitting: Obstetrics and Gynecology

## 2024-01-03 ENCOUNTER — Encounter: Payer: Self-pay | Admitting: Obstetrics and Gynecology

## 2024-01-03 ENCOUNTER — Telehealth: Payer: Self-pay

## 2024-01-03 DIAGNOSIS — R921 Mammographic calcification found on diagnostic imaging of breast: Secondary | ICD-10-CM

## 2024-01-03 NOTE — Telephone Encounter (Signed)
 Returned patient's call. Patient completed screening process and ineligible for the [facility] program. [facility].

## 2024-01-07 NOTE — Telephone Encounter (Signed)
Returned patient's call and patient updated spouse's income. Patient eligible and appointment scheduled with [facility].

## 2024-01-29 ENCOUNTER — Ambulatory Visit
Admission: RE | Admit: 2024-01-29 | Discharge: 2024-01-29 | Disposition: A | Discharge status: Home / Self Care | Payer: BC Managed Care – PPO | Source: Ambulatory Visit | Attending: Obstetrics and Gynecology | Admitting: Obstetrics and Gynecology

## 2024-01-29 DIAGNOSIS — R921 Mammographic calcification found on diagnostic imaging of breast: Secondary | ICD-10-CM
